# Patient Record
Sex: Female | Born: 1945 | Race: White | Hispanic: No | Marital: Married
Health system: Southern US, Community
[De-identification: ages and names within clinical notes are randomized; demographics above are authoritative.]

## PROBLEM LIST (undated history)

## (undated) DIAGNOSIS — E119 Type 2 diabetes mellitus without complications: Secondary | ICD-10-CM

## (undated) DIAGNOSIS — I10 Essential (primary) hypertension: Secondary | ICD-10-CM

## (undated) DIAGNOSIS — E785 Hyperlipidemia, unspecified: Secondary | ICD-10-CM

## (undated) DIAGNOSIS — M810 Age-related osteoporosis without current pathological fracture: Secondary | ICD-10-CM

## (undated) HISTORY — PX: SPINE SURGERY: SHX786

## (undated) HISTORY — DX: Hyperlipidemia, unspecified: E78.5

## (undated) HISTORY — DX: Type 2 diabetes mellitus without complications: E11.9

## (undated) HISTORY — DX: Age-related osteoporosis without current pathological fracture: M81.0

## (undated) HISTORY — DX: Essential (primary) hypertension: I10

## (undated) HISTORY — PX: TUBAL LIGATION: SHX77

---

## 1989-03-09 HISTORY — PX: LUMBAR DISC SURGERY: SHX700

## 1998-12-23 ENCOUNTER — Other Ambulatory Visit: Admission: RE | Admit: 1998-12-23 | Discharge: 1998-12-23 | Payer: Self-pay | Admitting: Gynecology

## 1999-12-31 ENCOUNTER — Other Ambulatory Visit: Admission: RE | Admit: 1999-12-31 | Discharge: 1999-12-31 | Payer: Self-pay | Admitting: Gynecology

## 2006-06-14 ENCOUNTER — Ambulatory Visit: Payer: Self-pay | Admitting: Gastroenterology

## 2006-06-28 ENCOUNTER — Ambulatory Visit: Payer: Self-pay | Admitting: Gastroenterology

## 2008-08-09 ENCOUNTER — Ambulatory Visit (HOSPITAL_COMMUNITY): Admission: RE | Admit: 2008-08-09 | Discharge: 2008-08-09 | Payer: Self-pay | Admitting: Family Medicine

## 2011-04-12 ENCOUNTER — Ambulatory Visit (INDEPENDENT_AMBULATORY_CARE_PROVIDER_SITE_OTHER): Payer: 59 | Admitting: Family Medicine

## 2011-04-12 DIAGNOSIS — R21 Rash and other nonspecific skin eruption: Secondary | ICD-10-CM

## 2011-04-12 DIAGNOSIS — E785 Hyperlipidemia, unspecified: Secondary | ICD-10-CM

## 2011-04-12 DIAGNOSIS — I10 Essential (primary) hypertension: Secondary | ICD-10-CM

## 2011-04-12 NOTE — Progress Notes (Signed)
Verbal consent obtained from the patient.  Local anesthesia with 2.5 cc 1% Lidocaine with epi.  Sterile prep and drape.  Elliptical excision of lesion en toto.  Packaged for pathology.  Would closed with #5 5-0 prolene HM and #1 5-0 prolene SI sutures.  Cleansed and dressed.

## 2011-04-12 NOTE — Patient Instructions (Signed)
WOUND CARE Please return in 5 days to have your stitches/staples removed or sooner if you have concerns.  Keep area clean and dry for 24 hours. Do not remove bandage, if applied.  After 24 hours, remove bandage and wash wound gently with mild soap and warm water. Reapply a new bandage after cleaning wound, if directed.  Continue daily cleansing with soap and water until stitches/staples are removed.  Do not apply any ointments or creams to the wound while stitches/staples are in place, as this may cause delayed healing.  Notify the office if you experience any of the following signs of infection: Swelling, redness, pus drainage, streaking, fever >101.0 F  Notify the office if you experience excessive bleeding that does not stop after 15-20 minutes of constant, firm pressure.  

## 2011-04-12 NOTE — Progress Notes (Signed)
66 year old woman who comes in complaining of a enlarging lesion just below the left year at the temporomandibular joint area.  The skin lesion has been there for more than 6 months recently started enlarging and become irritated.  Objective: A verrucous pigmented lesion approximately 1/2 cm in diameter is noted below the left ear. It is nontender but mildly inflamed at the margins.

## 2011-04-18 ENCOUNTER — Ambulatory Visit (INDEPENDENT_AMBULATORY_CARE_PROVIDER_SITE_OTHER): Payer: 59 | Admitting: Family Medicine

## 2011-04-18 VITALS — BP 131/74 | HR 64 | Temp 97.8°F | Resp 16 | Ht 67.0 in | Wt 185.2 lb

## 2011-04-18 DIAGNOSIS — Z4802 Encounter for removal of sutures: Secondary | ICD-10-CM

## 2011-04-18 NOTE — Progress Notes (Signed)
  Subjective:    Patient ID: Kristen Pratt, female    DOB: 1946-03-07, 66 y.o.   MRN: [id number]  HPI  Presents for SR. Lesion healed with redness, pain or drainage. Path report not yet back  Review of Systems     Objective:   Physical Exam  Skin: Laceration: suture line clean and intact, left jaw line.             Assessment & Plan:  Excision of verrucus lesion  A/P SR Follow up on pathology Anticipatory guidance

## 2011-05-11 ENCOUNTER — Other Ambulatory Visit: Payer: Self-pay | Admitting: Family Medicine

## 2011-09-26 ENCOUNTER — Other Ambulatory Visit: Payer: Self-pay | Admitting: Physician Assistant

## 2011-12-30 ENCOUNTER — Other Ambulatory Visit: Payer: Self-pay | Admitting: Radiology

## 2011-12-30 NOTE — Telephone Encounter (Signed)
Please advise on renewal of Triamcinolone cream. Pended Rx, have gotten 2 faxed from [facility] they can not send electronic b/c was written by Dr Hal Hope.

## 2011-12-31 ENCOUNTER — Other Ambulatory Visit: Payer: Self-pay | Admitting: *Deleted

## 2012-02-19 ENCOUNTER — Encounter: Payer: 59 | Admitting: Family Medicine

## 2012-02-20 ENCOUNTER — Ambulatory Visit: Payer: 59

## 2012-02-20 ENCOUNTER — Ambulatory Visit (INDEPENDENT_AMBULATORY_CARE_PROVIDER_SITE_OTHER): Payer: 59 | Admitting: Internal Medicine

## 2012-02-20 VITALS — BP 140/84 | HR 108 | Temp 98.6°F | Resp 18 | Ht 66.5 in | Wt 178.0 lb

## 2012-02-20 DIAGNOSIS — I10 Essential (primary) hypertension: Secondary | ICD-10-CM

## 2012-02-20 DIAGNOSIS — E785 Hyperlipidemia, unspecified: Secondary | ICD-10-CM

## 2012-02-20 DIAGNOSIS — R05 Cough: Secondary | ICD-10-CM

## 2012-02-20 DIAGNOSIS — L301 Dyshidrosis [pompholyx]: Secondary | ICD-10-CM

## 2012-02-20 MED ORDER — TRIAMCINOLONE ACETONIDE 0.1 % EX CREA: TOPICAL_CREAM | Freq: Two times a day (BID) | CUTANEOUS | Status: DC | PRN

## 2012-02-20 MED ORDER — DOXYCYCLINE HYCLATE 100 MG PO CAPS: 100.0000 mg | ORAL_CAPSULE | Freq: Two times a day (BID) | ORAL | Status: DC

## 2012-02-20 MED ORDER — BENZONATATE 100 MG PO CAPS: 100.0000 mg | ORAL_CAPSULE | Freq: Three times a day (TID) | ORAL | Status: DC | PRN

## 2012-02-20 MED ORDER — LOSARTAN POTASSIUM 50 MG PO TABS: 50.0000 mg | ORAL_TABLET | Freq: Every day | ORAL | Status: DC

## 2012-02-20 MED ORDER — PRAVASTATIN SODIUM 40 MG PO TABS: 40.0000 mg | ORAL_TABLET | Freq: Every day | ORAL | Status: DC

## 2012-02-20 NOTE — Progress Notes (Signed)
Subjective:    Patient ID: Kristen Pratt, female    DOB: 11-26-1945, 66 y.o.   MRN: [id number]  HPI This 66 y.o. female presents for evaluation of cough.  Dry and "hacky."  Coughing causes SOB and back pain.  Initially had laryngitis, but no sore throat or congestion.  No fever, chills, GI/GU symptoms.  She was seen 5 days ago at a [facility], where she was advised to use OTC chloricidin, but it hasn't helped.  Past Medical History  Diagnosis Date  . Hyperlipidemia   . Hypertension     Past Surgical History  Procedure Date  . Tubal ligation   . Lumbar disc surgery 1991    no complications  . Spine surgery     lumbar    Prior to Admission medications   Medication Sig Start Date End Date Taking? Authorizing Provider  aspirin 81 MG tablet Take 160 mg by mouth daily.   Yes Historical Provider, MD  losartan (COZAAR) 50 MG tablet Take 1 tablet (50 mg total) by mouth daily. NEEDS OFFICE VISIT/LABS FOR MORE 09/26/11  Yes Pattricia Boss, PA-C  pravastatin (PRAVACHOL) 40 MG tablet Take 1 tablet (40 mg total) by mouth daily. NEEDS OFFICE VISIT/LABS FOR MORE 09/26/11  Yes Pattricia Boss, PA-C  triamcinolone cream (KENALOG) 0.1 % Apply topically 2 (two) times daily as needed.   Yes Historical Provider, MD    No Known Allergies  History   Social History  . Marital Status: Married    Spouse Name: N/A    Number of Children: 1  . Years of Education: N/A   Occupational History  . Mail Handler    Social History Main Topics  . Smoking status: Passive Smoke Exposure - Never Smoker  . Smokeless tobacco: Never Used  . Alcohol Use: 0.0 - 0.6 oz/week    0-1 Glasses of wine per week     Comment: "every once in a while"  . Drug Use: No  . Sexually Active: Not on file   Other Topics Concern  . Not on file   Social History Narrative   Lives with husband.    Family History  Problem Relation Age of Onset  . Stroke Mother     Review of Systems As above.  Otherwise negative.   Objective:   Physical Exam Blood pressure 140/84, pulse 108, temperature 98.6 F (37 C), temperature source Oral, resp. rate 18, height 5' 6.5" (1.689 m), weight 178 lb (80.74 kg), SpO2 97.00%. Body mass index is 28.30 kg/(m^2). Well-developed, well nourished WF who is awake, alert and oriented, in NAD. HEENT: [place]/AT, sclera and conjunctiva are clear.  EAC are patent, TMs are normal in appearance. Nasal mucosa is pink and moist. OP is clear. Neck: supple, non-tender, no lymphadenopathy, thyromegaly. Heart: RRR, no murmur Lungs: normal effort, CTA, though course. Extremities: no cyanosis, clubbing or edema. Skin: warm and dry.  Thickened, cracking skin on the palms of both hands c/w dishidrotic eczema. Psychologic: good mood and appropriate affect, normal speech and behavior.  CXR: [facility] reading (PRIMARY) by  Dr. Merla Riches.  Increased markings, no acute process.     Assessment & Plan:   1. Cough  DG Chest 2 View, benzonatate (TESSALON) 100 MG capsule, doxycycline (VIBRAMYCIN) 100 MG capsule  2. Eczema, dyshidrotic  triamcinolone cream (KENALOG) 0.1 %  3. Hyperlipidemia  pravastatin (PRAVACHOL) 40 MG tablet  4. Hypertension  losartan (COZAAR) 50 MG tablet   Reschedule CPE.  Supportive care. I have reviewed and  agree with documentation. Robert P. Merla Riches, M.D.

## 2012-02-20 NOTE — Patient Instructions (Signed)
Get plenty of rest and drink at least 64 ounces of water daily. 

## 2012-02-22 NOTE — Progress Notes (Signed)
Left msg for pt to schedule CPE. Kristen Pratt

## 2012-02-24 NOTE — Progress Notes (Signed)
Sent reminder letter to pt to schedule CPE with Dr. Clelia Croft (pt no showed for previous appt). Kristen Pratt

## 2012-06-05 ENCOUNTER — Ambulatory Visit (INDEPENDENT_AMBULATORY_CARE_PROVIDER_SITE_OTHER): Payer: 59 | Admitting: Emergency Medicine

## 2012-06-05 ENCOUNTER — Ambulatory Visit: Payer: 59

## 2012-06-05 VITALS — BP 153/77 | HR 73 | Temp 97.5°F | Resp 18 | Ht 66.5 in | Wt 182.0 lb

## 2012-06-05 DIAGNOSIS — R300 Dysuria: Secondary | ICD-10-CM

## 2012-06-05 DIAGNOSIS — R319 Hematuria, unspecified: Secondary | ICD-10-CM

## 2012-06-05 DIAGNOSIS — R351 Nocturia: Secondary | ICD-10-CM

## 2012-06-05 LAB — POCT URINALYSIS DIPSTICK
Bilirubin, UA: NEGATIVE
Glucose, UA: NEGATIVE
Ketones, UA: NEGATIVE
Leukocytes, UA: NEGATIVE
Nitrite, UA: NEGATIVE
Protein, UA: NEGATIVE
Spec Grav, UA: 1.01
Urobilinogen, UA: 0.2
pH, UA: 6

## 2012-06-05 LAB — POCT CBC
Granulocyte percent: 71.2 %G (ref 37–80)
HCT, POC: 48.9 % — AB (ref 37.7–47.9)
Hemoglobin: 15.8 g/dL (ref 12.2–16.2)
MCH, POC: 30.3 pg (ref 27–31.2)
MCV: 93.7 fL (ref 80–97)
MID (cbc): 0.6 (ref 0–0.9)
RBC: 5.22 M/uL (ref 4.04–5.48)
WBC: 7.6 10*3/uL (ref 4.6–10.2)

## 2012-06-05 LAB — POCT UA - MICROSCOPIC ONLY
Bacteria, U Microscopic: NEGATIVE
Casts, Ur, LPF, POC: NEGATIVE
Mucus, UA: NEGATIVE
Yeast, UA: NEGATIVE

## 2012-06-05 MED ORDER — CIPROFLOXACIN HCL 250 MG PO TABS: 250.0000 mg | ORAL_TABLET | Freq: Two times a day (BID) | ORAL | Status: DC

## 2012-06-05 NOTE — Progress Notes (Signed)
Subjective:    Patient ID: Kristen Pratt, female    DOB: [birth year], 67 y.o.   MRN: [id number]  HPI  Pt presents with frequency, dysuria-woke about 5-6 times last night to urinate No fever/chills, back pain, nausea or vomiting Has never had a UTI but has had kidney stones. Generally healthy-just HTN and hyperlipidemia  Review of Systems     Objective:   Physical Exam    Results for orders placed in visit on 06/05/12  POCT URINALYSIS DIPSTICK      Result Value Range   Color, UA yellow     Clarity, UA clear     Glucose, UA neg     Bilirubin, UA neg     Ketones, UA neg     Spec Grav, UA 1.010     Blood, UA small     pH, UA 6.0     Protein, UA neg     Urobilinogen, UA 0.2     Nitrite, UA neg     Leukocytes, UA Negative    POCT UA - MICROSCOPIC ONLY      Result Value Range   WBC, Ur, HPF, POC 0-1     RBC, urine, microscopic 1-2     Bacteria, U Microscopic neg     Mucus, UA neg     Epithelial cells, urine per micros 0-1     Crystals, Ur, HPF, POC triple phos     Casts, Ur, LPF, POC neg     Yeast, UA neg     [facility] reading (PRIMARY) by  Dr. Cleta Alberts there are 3 areas of calcification in the area of the lower left ureter are suspicious for stones . Results for orders placed in visit on 06/05/12  POCT URINALYSIS DIPSTICK      Result Value Range   Color, UA yellow     Clarity, UA clear     Glucose, UA neg     Bilirubin, UA neg     Ketones, UA neg     Spec Grav, UA 1.010     Blood, UA small     pH, UA 6.0     Protein, UA neg     Urobilinogen, UA 0.2     Nitrite, UA neg     Leukocytes, UA Negative    POCT UA - MICROSCOPIC ONLY      Result Value Range   WBC, Ur, HPF, POC 0-1     RBC, urine, microscopic 1-2     Bacteria, U Microscopic neg     Mucus, UA neg     Epithelial cells, urine per micros 0-1     Crystals, Ur, HPF, POC triple phos     Casts, Ur, LPF, POC neg     Yeast, UA neg    POCT CBC      Result Value Range   WBC 7.6  4.6 - 10.2 K/uL   Lymph, poc 1.6   0.6 - 3.4   POC LYMPH PERCENT 20.9  10 - 50 %L   MID (cbc) 0.6  0 - 0.9   POC MID % 7.9  0 - 12 %M   POC Granulocyte 5.4  2 - 6.9   Granulocyte percent 71.2  37 - 80 %G   RBC 5.22  4.04 - 5.48 M/uL   Hemoglobin 15.8  12.2 - 16.2 g/dL   HCT, POC 40.9 (*) 81.1 - 47.9 %   MCV 93.7  80 - 97 fL   MCH, POC 30.3  27 -  31.2 pg   MCHC 32.3  31.8 - 35.4 g/dL   RDW, POC 16.1     Platelet Count, POC 254  142 - 424 K/uL   MPV 10.0  0 - 99.8 fL   [facility] reading (PRIMARY) by  Dr Cleta Alberts there are 3 tiny areas of what appear to be calcific densities in the lower left ureter.       Assessment & Plan:  KUB is suspicious for stones in the left lower ureter was set up a CT abdomen and pelvis without contrast. Patient was given a strainer culture was done we'll treat with Cipro for 5 days

## 2012-06-06 LAB — URINE CULTURE: Colony Count: 40000

## 2012-06-10 ENCOUNTER — Ambulatory Visit
Admission: RE | Admit: 2012-06-10 | Discharge: 2012-06-10 | Disposition: A | Discharge status: Home / Self Care | Payer: 59 | Source: Ambulatory Visit | Attending: Emergency Medicine | Admitting: Emergency Medicine

## 2012-06-10 DIAGNOSIS — R319 Hematuria, unspecified: Secondary | ICD-10-CM

## 2012-06-10 DIAGNOSIS — R300 Dysuria: Secondary | ICD-10-CM

## 2013-05-15 ENCOUNTER — Encounter: Payer: Self-pay | Admitting: Family Medicine

## 2013-05-15 ENCOUNTER — Ambulatory Visit (INDEPENDENT_AMBULATORY_CARE_PROVIDER_SITE_OTHER): Payer: 59 | Admitting: Family Medicine

## 2013-05-15 VITALS — BP 144/70 | HR 70 | Temp 97.6°F | Resp 16 | Ht 67.0 in | Wt 185.0 lb

## 2013-05-15 DIAGNOSIS — R7301 Impaired fasting glucose: Secondary | ICD-10-CM

## 2013-05-15 DIAGNOSIS — D751 Secondary polycythemia: Secondary | ICD-10-CM

## 2013-05-15 DIAGNOSIS — M7500 Adhesive capsulitis of unspecified shoulder: Secondary | ICD-10-CM

## 2013-05-15 DIAGNOSIS — E785 Hyperlipidemia, unspecified: Secondary | ICD-10-CM

## 2013-05-15 DIAGNOSIS — Z0000 Encounter for general adult medical examination without abnormal findings: Secondary | ICD-10-CM

## 2013-05-15 DIAGNOSIS — I10 Essential (primary) hypertension: Secondary | ICD-10-CM

## 2013-05-15 DIAGNOSIS — L301 Dyshidrosis [pompholyx]: Secondary | ICD-10-CM

## 2013-05-15 DIAGNOSIS — Z23 Encounter for immunization: Secondary | ICD-10-CM

## 2013-05-15 LAB — COMPREHENSIVE METABOLIC PANEL
ALBUMIN: 4.6 g/dL (ref 3.5–5.2)
ALT: 14 U/L (ref 0–35)
AST: 19 U/L (ref 0–37)
Alkaline Phosphatase: 90 U/L (ref 39–117)
BUN: 20 mg/dL (ref 6–23)
CALCIUM: 9.8 mg/dL (ref 8.4–10.5)
CHLORIDE: 101 meq/L (ref 96–112)
CO2: 27 mEq/L (ref 19–32)
CREATININE: 0.73 mg/dL (ref 0.50–1.10)
Glucose, Bld: 108 mg/dL — ABNORMAL HIGH (ref 70–99)
POTASSIUM: 4 meq/L (ref 3.5–5.3)
SODIUM: 138 meq/L (ref 135–145)
Total Bilirubin: 0.6 mg/dL (ref 0.2–1.2)
Total Protein: 7.4 g/dL (ref 6.0–8.3)

## 2013-05-15 LAB — CBC
HCT: 43.9 % (ref 36.0–46.0)
Hemoglobin: 15.2 g/dL — ABNORMAL HIGH (ref 12.0–15.0)
MCH: 30.4 pg (ref 26.0–34.0)
MCHC: 34.6 g/dL (ref 30.0–36.0)
MCV: 87.8 fL (ref 78.0–100.0)
PLATELETS: 219 10*3/uL (ref 150–400)
RBC: 5 MIL/uL (ref 3.87–5.11)
RDW: 13.4 % (ref 11.5–15.5)
WBC: 7.2 10*3/uL (ref 4.0–10.5)

## 2013-05-15 LAB — LIPID PANEL
Cholesterol: 138 mg/dL (ref 0–200)
HDL: 33 mg/dL — AB (ref >39)
LDL CALC: 87 mg/dL (ref 0–99)
Total CHOL/HDL Ratio: 4.2 Ratio
Triglycerides: 91 mg/dL (ref <150)
VLDL: 18 mg/dL (ref 0–40)

## 2013-05-15 LAB — TSH: TSH: 2.011 u[IU]/mL (ref 0.350–4.500)

## 2013-05-15 MED ORDER — LOSARTAN POTASSIUM 50 MG PO TABS: 50.0000 mg | ORAL_TABLET | Freq: Every day | ORAL | Status: DC

## 2013-05-15 MED ORDER — TRIAMCINOLONE ACETONIDE 0.1 % EX CREA: TOPICAL_CREAM | Freq: Two times a day (BID) | CUTANEOUS | Status: DC | PRN

## 2013-05-15 MED ORDER — ATORVASTATIN CALCIUM 40 MG PO TABS: 40.0000 mg | ORAL_TABLET | Freq: Every day | ORAL | Status: DC

## 2013-05-15 NOTE — Progress Notes (Signed)
[facility] [address] [place] [address] [phone]  Date:  05/15/2013   Name:  Kristen Pratt   DOB:  1945-06-25   MRN:  [id number]  PCP:  No primary provider on file.    Chief Complaint: Annual Exam   History of Present Illness:  Kristen Pratt is a 68 y.o. very pleasant female patient who presents with the following:  Here today for an annual PE.  Last labs about a year ago. History of HTN and high cholesterol.   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  She does have dyshydrotic eczema on her hands. She also has noted some stiffness in her left shoulder for about 6 months- she has a hard time abducting as far as on the right.    Last Td given in 2006- she would like to have Tdap today  Patient Active Problem List   Diagnosis Date Noted  . Eczema, dyshidrotic 02/20/2012  . Hyperlipidemia 04/12/2011  . Hypertension 04/12/2011    Past Medical History  Diagnosis Date  . Hyperlipidemia   . Hypertension     Past Surgical History  Procedure Laterality Date  . Tubal ligation    . Lumbar disc surgery  9326    no complications  . Spine surgery      lumbar    History  Substance Use Topics  . Smoking status: Passive Smoke Exposure - Never Smoker  . Smokeless tobacco: Never Used  . Alcohol Use: 0 - .6 oz/week    0-1 Glasses of wine per week     Comment: "every once in a while"    Family History  Problem Relation Age of Onset  . Stroke Mother     No Known Allergies  Medication list has been reviewed and updated.  Current Outpatient Prescriptions on File Prior to Visit  Medication Sig Dispense Refill  . aspirin 81 MG tablet Take 160 mg by mouth daily.      Marland Kitchen losartan (COZAAR) 50 MG tablet Take 1 tablet (50 mg  total) by mouth daily.  90 tablet  1  . triamcinolone cream (KENALOG) 0.1 % Apply topically 2 (two) times daily as needed.  30 g  2   No current facility-administered medications on file prior to visit.    Review of Systems:  As per HPI- otherwise negative.   Physical Examination: Filed Vitals:   05/15/13 0813  BP: 152/71  Pulse: 70  Temp: 97.6 F (36.4 C)  Resp: 16   Filed Vitals:   05/15/13 0813  Height: 5\' 7"  (1.702 m)  Weight: 185 lb (83.915 kg)   Body mass index is 28.97 kg/(m^2). Ideal Body Weight: Weight in (lb) to have BMI = 25: 159.3  GEN: WDWN, NAD, Non-toxic, A & O x 3, overweight, looks well HEENT: Atraumatic, Normocephalic. Neck supple. No masses, No LAD.  Bilateral TM wnl, oropharynx normal.  PEERL,EOMI.  Ears and Nose: No external deformity. CV: RRR, No M/G/R. No JVD. No thrill. No extra heart sounds. PULM: CTA B, no wheezes, crackles, rhonchi. No retractions. No resp. distress. No accessory muscle use. ABD: S, NT, ND, +BS. No rebound. No HSM. EXTR: No c/c/e NEURO Normal gait.  PSYCH:  Normally interactive. Conversant. Not depressed or anxious appearing.  Calm demeanor.  Left shoulder: she has flexion to 85- 90 degrees, but abduction only to about 45 degrees. Internal and external rotation are also severely limited.  Normal hand strength and biceps/ triceps strength, normal deltoid strength.  Breast exam: normal bilaterally, no masses/ dimpling/discharge  Wt Readings from Last 3 Encounters:  05/15/13 185 lb (83.915 kg)  06/05/12 182 lb (82.555 kg)  02/20/12 178 lb (80.74 kg)     Assessment and Plan: Annual physical exam - Plan: CBC, TSH, Tdap vaccine greater than or equal to 7yo IM, CANCELED: POCT urinalysis dipstick  Other and unspecified hyperlipidemia - Plan: Lipid panel, atorvastatin (LIPITOR) 40 MG tablet  Essential hypertension, benign - Plan: Comprehensive metabolic panel  Hypertension - Plan: losartan (COZAAR) 50 MG tablet  Eczema,  dyshidrotic - Plan: triamcinolone cream (KENALOG) 0.1 %  Frozen shoulder - Plan: Ambulatory referral to Physical Therapy  Updated labs, did refills today, update immunizations  See patient instructions for more details.     Signed Lamar Blinks, MD

## 2013-05-15 NOTE — Addendum Note (Signed)
Addended by: Lamar Blinks C on: 05/15/2013 08:31 PM   Modules accepted: Orders

## 2013-05-15 NOTE — Patient Instructions (Signed)
Please keep an eye on your BP- we would like you to run less than 140/85.  If you find that you are running higher than this on average please give me a call or mychart message. Try to check your BP once a week or so for the next 6 weeks.   You have gained about 7 lbs since 2013.  Please keep an eye on your weight and physical activity.  Unfortunately keeping your weight steady can be challenging as we get older, so it is important to be vigilant.  Please work on losing about 5 lbs over the next year or so by making small changes  I will be in touch with your labs when they come in  You can get your shingles shot at your convenience- this can be done at many major drug stores and is a "one time" shot.    I will get your set up with physical therapy to work on your shoulder!

## 2014-05-09 DIAGNOSIS — Z1239 Encounter for other screening for malignant neoplasm of breast: Secondary | ICD-10-CM | POA: Diagnosis not present

## 2014-05-09 DIAGNOSIS — Z0000 Encounter for general adult medical examination without abnormal findings: Secondary | ICD-10-CM | POA: Diagnosis not present

## 2014-05-09 DIAGNOSIS — Z13820 Encounter for screening for osteoporosis: Secondary | ICD-10-CM | POA: Diagnosis not present

## 2014-05-09 DIAGNOSIS — E782 Mixed hyperlipidemia: Secondary | ICD-10-CM | POA: Diagnosis not present

## 2014-05-09 DIAGNOSIS — I10 Essential (primary) hypertension: Secondary | ICD-10-CM | POA: Diagnosis not present

## 2014-05-10 DIAGNOSIS — R7309 Other abnormal glucose: Secondary | ICD-10-CM | POA: Diagnosis not present

## 2014-05-14 DIAGNOSIS — Z1231 Encounter for screening mammogram for malignant neoplasm of breast: Secondary | ICD-10-CM | POA: Diagnosis not present

## 2014-05-14 DIAGNOSIS — M8580 Other specified disorders of bone density and structure, unspecified site: Secondary | ICD-10-CM | POA: Diagnosis not present

## 2014-06-04 DIAGNOSIS — Z1151 Encounter for screening for human papillomavirus (HPV): Secondary | ICD-10-CM | POA: Diagnosis not present

## 2014-06-04 DIAGNOSIS — N889 Noninflammatory disorder of cervix uteri, unspecified: Secondary | ICD-10-CM | POA: Diagnosis not present

## 2014-06-04 DIAGNOSIS — E119 Type 2 diabetes mellitus without complications: Secondary | ICD-10-CM | POA: Diagnosis not present

## 2014-06-04 DIAGNOSIS — Z124 Encounter for screening for malignant neoplasm of cervix: Secondary | ICD-10-CM | POA: Diagnosis not present

## 2016-04-20 ENCOUNTER — Encounter: Payer: Self-pay | Admitting: Gastroenterology

## 2016-06-20 ENCOUNTER — Ambulatory Visit (INDEPENDENT_AMBULATORY_CARE_PROVIDER_SITE_OTHER): Payer: Medicare HMO | Admitting: Family Medicine

## 2016-06-20 VITALS — BP 159/73 | HR 78 | Temp 97.9°F | Resp 17 | Ht 66.5 in | Wt 195.1 lb

## 2016-06-20 DIAGNOSIS — Z0000 Encounter for general adult medical examination without abnormal findings: Secondary | ICD-10-CM

## 2016-06-20 DIAGNOSIS — L301 Dyshidrosis [pompholyx]: Secondary | ICD-10-CM | POA: Diagnosis not present

## 2016-06-20 DIAGNOSIS — I10 Essential (primary) hypertension: Secondary | ICD-10-CM | POA: Diagnosis not present

## 2016-06-20 DIAGNOSIS — E119 Type 2 diabetes mellitus without complications: Secondary | ICD-10-CM

## 2016-06-20 DIAGNOSIS — L570 Actinic keratosis: Secondary | ICD-10-CM

## 2016-06-20 DIAGNOSIS — E785 Hyperlipidemia, unspecified: Secondary | ICD-10-CM | POA: Diagnosis not present

## 2016-06-20 LAB — POCT URINALYSIS DIP (MANUAL ENTRY)
BILIRUBIN UA: NEGATIVE
BILIRUBIN UA: NEGATIVE mg/dL
Blood, UA: NEGATIVE
Glucose, UA: NEGATIVE mg/dL
LEUKOCYTES UA: NEGATIVE
NITRITE UA: NEGATIVE
PH UA: 7 (ref 5.0–8.0)
Protein Ur, POC: NEGATIVE mg/dL
Spec Grav, UA: <=1.005 — AB (ref 1.010–1.025)
Urobilinogen, UA: 0.2 E.U./dL

## 2016-06-20 LAB — POCT GLYCOSYLATED HEMOGLOBIN (HGB A1C): HEMOGLOBIN A1C: 6.7

## 2016-06-20 NOTE — Patient Instructions (Addendum)
  You're being referred to a dermatologist. You should hear from our referrals department in the next week. If you do not please call back and ask what became of the referral.  We will contact you with results of your labs.  The hemoglobin A1c which was done here in the office was 6.7, which is indicating diabetes in fair control. The goal of the hemoglobin A1c in a diabetic is to keep it between 6 and 7.  With diabetes is important to try to get regular exercise, such as a daily walk. Also try to eat less to that you can lose a little weight.  On the way out make an appointment to follow-up regularly with one of the other providers here at this practice. They can follow up on the results of your bone density, mammogram, and lab work also. We would like to ask you to get the results of the bone density and mammogram sent here to this office.   IF you received an x-ray today, you will receive an invoice from [facility]. Please contact [facility] at [phone] with questions or concerns regarding your invoice.   IF you received labwork today, you will receive an invoice from [facility]. Please contact [facility] at [phone] with questions or concerns regarding your invoice.   Our billing staff will not be able to assist you with questions regarding bills from these companies.  You will be contacted with the lab results as soon as they are available. The fastest way to get your results is to activate your My Chart account. Instructions are located on the last page of this paperwork. If you have not heard from [place] regarding the results in 2 weeks, please contact this office.

## 2016-06-20 NOTE — Progress Notes (Signed)
Patient ID: Kristen Pratt, female    DOB: 12-16-1945  Age: 71 y.o. MRN: [id number]  Chief Complaint  Patient presents with  . Annual Exam    CPE    Subjective:   Annual physical examination:  Patient is here for her annual physical examination. She has no major problems with complaints. She just felt like he was time to get a physical. She is previously been going to physicians in the [facility] area and would like to be back up here in [place]. She has come here some years ago.  Past history: Gen. healthy. He had back surgery many years ago. She has a history of type 2 diabetes, hypertension, hyperlipidemia, history of porosis. No major changes in them. Does not need any medications. Her medication list in the chart as of today.    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 Does some walking. Does not smoke, drink, or use substances. Has been married to the same man for 25 years. No fears of STDs.  Review of systems Constitutional: Unremarkable HEENT: Unremarkable Cardiovascular: Unremarkable Respiratory: Unremarkable GI: Unremarkable GU: Unremarkable. She has been following up with a gynecologist for some vaginal symptoms which seem to have resolved now. She is scheduled for a mammogram sometime in the near future Musculoskeletal: Has osteoporosis and is on medication for that. She is scheduled for bone density in a few weeks. Dermatologic: Has skin lesions on her face. Does not see a dermatologist Neurologic: Unremarkable Psychiatric: Unremarkable       Current allergies, medications, problem list, past/family and social histories reviewed.  Objective:  BP (!) 159/73   Pulse 78   Temp 97.9 F (36.6 C) (Oral)   Resp 17   Ht 5' 6.5" (1.689 m)   Wt 195 lb 2 oz (88.5 kg)   SpO2 95%   BMI 31.02 kg/m   Healthy lady, a little overweight. TMs normal. Eyes PERRLA. Fundi  benign. Wears glasses. Has a regular doctor she sees annually. She has multiple [place] getting these on her face including a prominent seborrheic keratosis on the left temple area and some actinic keratoses and smaller seborrheic keratoses. Her throat is clear. Wears dentures upper and lower. Neck supple without nodes. No carotid bruits. Chest clear to auscultation. Heart regular without murmurs, gallops, or arrhythmias. Breasts are full without a mass. No axillary nodes. Abdomen soft without mass or tenderness. No inguinal nodes. Extremities without edema. Skin warm and dry.  Assessment & Plan:   Assessment: 1. Annual physical exam   2. Essential hypertension   3. Eczema, dyshidrotic   4. Hyperlipidemia, unspecified hyperlipidemia type   5. Type 2 diabetes mellitus without complication, without long-term current use of insulin ([place])   6. Actinic keratoses       Plan: Labs are ordered. Will refer to a dermatologist. She needs to be plugged in for regular doctor here.  Orders Placed This Encounter  Procedures  . Comprehensive metabolic panel  . Lipid panel  . CBC  . Ambulatory referral to Dermatology    Referral Priority:   Routine    Referral Type:   Consultation    Referral Reason:   Specialty Services Required    Requested Specialty:   Dermatology    Number of Visits Requested:   1  . POCT glycosylated hemoglobin (Hb A1C)  . POCT urinalysis dipstick   Results for orders placed or performed  in visit on 06/20/16  POCT glycosylated hemoglobin (Hb A1C)  Result Value Ref Range   Hemoglobin A1C 6.7   POCT urinalysis dipstick  Result Value Ref Range   Color, UA yellow yellow   Clarity, UA clear clear   Glucose, UA negative negative mg/dL   Bilirubin, UA negative negative   Ketones, POC UA negative negative mg/dL   Spec Grav, UA <=1.005 (A) 1.010 - 1.025   Blood, UA negative negative   pH, UA 7.0 5.0 - 8.0   Protein Ur, POC negative negative mg/dL   Urobilinogen, UA 0.2 0.2 or  1.0 E.U./dL   Nitrite, UA Negative Negative   Leukocytes, UA Negative Negative     Meds ordered this encounter  Medications  . alendronate (FOSAMAX) 70 MG tablet    Sig: Take 70 mg by mouth once a week. Take on the same day each week  . metFORMIN (GLUCOPHAGE-XR) 500 MG 24 hr tablet    Sig: Take 500 mg by mouth daily.         Patient Instructions    You're being referred to a dermatologist. You should hear from our referrals department in the next week. If you do not please call back and ask what became of the referral.  We will contact you with results of your labs.  The hemoglobin A1c which was done here in the office was 6.7, which is indicating diabetes in fair control. The goal of the hemoglobin A1c in a diabetic is to keep it between 6 and 7.  With diabetes is important to try to get regular exercise, such as a daily walk. Also try to eat less to that you can lose a little weight.  On the way out make an appointment to follow-up regularly with one of the other providers here at this practice. They can follow up on the results of your bone density, mammogram, and lab work also. We would like to ask you to get the results of the bone density and mammogram sent here to this office.   IF you received an x-ray today, you will receive an invoice from [facility]. Please contact [facility] at [phone] with questions or concerns regarding your invoice.   IF you received labwork today, you will receive an invoice from [facility]. Please contact [facility] at [phone] with questions or concerns regarding your invoice.   Our billing staff will not be able to assist you with questions regarding bills from these companies.  You will be contacted with the lab results as soon as they are available. The fastest way to get your results is to activate your My Chart account. Instructions are located on the last page of this paperwork. If you have not heard from [place]  regarding the results in 2 weeks, please contact this office.         Return in about 3 months (around 09/19/2016) for schedule with a regular provider.   HOPPER,DAVID, MD 06/20/2016

## 2016-06-21 LAB — COMPREHENSIVE METABOLIC PANEL
ALK PHOS: 99 IU/L (ref 39–117)
ALT: 20 IU/L (ref 0–32)
AST: 26 IU/L (ref 0–40)
Albumin/Globulin Ratio: 1.6 (ref 1.2–2.2)
Albumin: 4.6 g/dL (ref 3.6–4.8)
BUN/Creatinine Ratio: 19 (ref 12–28)
BUN: 14 mg/dL (ref 8–27)
Bilirubin Total: 0.5 mg/dL (ref 0.0–1.2)
CO2: 23 mmol/L (ref 18–29)
CREATININE: 0.73 mg/dL (ref 0.57–1.00)
Calcium: 9.6 mg/dL (ref 8.7–10.3)
Chloride: 98 mmol/L (ref 96–106)
GFR calc Af Amer: 98 mL/min/{1.73_m2} (ref >59)
GFR calc non Af Amer: 85 mL/min/{1.73_m2} (ref >59)
Globulin, Total: 2.9 g/dL (ref 1.5–4.5)
Glucose: 99 mg/dL (ref 65–99)
Potassium: 4.2 mmol/L (ref 3.5–5.2)
Sodium: 140 mmol/L (ref 134–144)
Total Protein: 7.5 g/dL (ref 6.0–8.5)

## 2016-06-21 LAB — CBC
Hematocrit: 44.8 % (ref 34.0–46.6)
Hemoglobin: 15 g/dL (ref 11.1–15.9)
MCH: 29.9 pg (ref 26.6–33.0)
MCHC: 33.5 g/dL (ref 31.5–35.7)
MCV: 89 fL (ref 79–97)
PLATELETS: 245 10*3/uL (ref 150–379)
RBC: 5.02 x10E6/uL (ref 3.77–5.28)
RDW: 13.7 % (ref 12.3–15.4)
WBC: 6.9 10*3/uL (ref 3.4–10.8)

## 2016-06-21 LAB — LIPID PANEL
CHOLESTEROL TOTAL: 130 mg/dL (ref 100–199)
Chol/HDL Ratio: 3.5 ratio (ref 0.0–4.4)
HDL: 37 mg/dL — AB (ref >39)
LDL CALC: 78 mg/dL (ref 0–99)
TRIGLYCERIDES: 73 mg/dL (ref 0–149)
VLDL CHOLESTEROL CAL: 15 mg/dL (ref 5–40)

## 2016-06-25 ENCOUNTER — Encounter: Payer: Self-pay | Admitting: Radiology

## 2016-07-03 ENCOUNTER — Encounter: Payer: Self-pay | Admitting: Family Medicine

## 2016-07-14 ENCOUNTER — Encounter: Payer: Self-pay | Admitting: Family Medicine

## 2016-07-14 DIAGNOSIS — M810 Age-related osteoporosis without current pathological fracture: Secondary | ICD-10-CM | POA: Insufficient documentation

## 2016-08-10 DIAGNOSIS — L940 Localized scleroderma [morphea]: Secondary | ICD-10-CM | POA: Diagnosis not present

## 2016-08-28 DIAGNOSIS — L820 Inflamed seborrheic keratosis: Secondary | ICD-10-CM | POA: Diagnosis not present

## 2016-08-28 DIAGNOSIS — L570 Actinic keratosis: Secondary | ICD-10-CM | POA: Diagnosis not present

## 2016-09-08 ENCOUNTER — Telehealth: Payer: Self-pay

## 2016-09-08 NOTE — Telephone Encounter (Signed)
Left message on patient's cell voicemail that she may pick up her records from the office ([address])

## 2016-09-08 NOTE — Telephone Encounter (Signed)
Records placed in envelope in RX pick up bin at [address] office.

## 2016-09-16 ENCOUNTER — Ambulatory Visit (INDEPENDENT_AMBULATORY_CARE_PROVIDER_SITE_OTHER): Payer: Medicare HMO | Admitting: Family Medicine

## 2016-09-16 ENCOUNTER — Encounter: Payer: Self-pay | Admitting: Family Medicine

## 2016-09-16 VITALS — BP 130/70 | HR 69 | Temp 97.9°F | Resp 17 | Ht 66.5 in | Wt 187.0 lb

## 2016-09-16 DIAGNOSIS — I10 Essential (primary) hypertension: Secondary | ICD-10-CM | POA: Diagnosis not present

## 2016-09-16 DIAGNOSIS — M810 Age-related osteoporosis without current pathological fracture: Secondary | ICD-10-CM | POA: Diagnosis not present

## 2016-09-16 DIAGNOSIS — E119 Type 2 diabetes mellitus without complications: Secondary | ICD-10-CM | POA: Diagnosis not present

## 2016-09-16 NOTE — Assessment & Plan Note (Addendum)
Continue Fosamax on year 3 of 5, weight bearing and continue multivitamin

## 2016-09-16 NOTE — Progress Notes (Signed)
Chief Complaint  Patient presents with  . Establish Care    HPI This is a patient of the practice that would like to establish care with a new provider in the office   She is here to discuss her osteoporosis She is taking Fosamax and a calcium with vitamin D supplement She is walking for exercise She also works twice a week  Diabetes mellitus She takes metformin  No side effects She is sticking to a diabetic diet Lab Results  Component Value Date   HGBA1C 6.7 06/20/2016   Lab Results  Component Value Date   CREATININE 0.73 06/20/2016     Past Medical History:  Diagnosis Date  . Diabetes mellitus without complication ([place])   . Hyperlipidemia   . Hypertension   . Osteoporosis     Current Outpatient Prescriptions  Medication Sig Dispense Refill  . alendronate (FOSAMAX) 70 MG tablet Take 70 mg by mouth once a week. Take on the same day each week    . aspirin 81 MG tablet Take 160 mg by mouth daily.    Marland Kitchen atorvastatin (LIPITOR) 40 MG tablet Take 1 tablet (40 mg total) by mouth daily. 90 tablet 3  . losartan (COZAAR) 50 MG tablet Take 1 tablet (50 mg total) by mouth daily. 90 tablet 3  . metFORMIN (GLUCOPHAGE-XR) 500 MG 24 hr tablet Take 500 mg by mouth daily.    Marland Kitchen triamcinolone cream (KENALOG) 0.1 % Apply topically 2 (two) times daily as needed. 454 g 3   No current facility-administered medications for this visit.     Allergies: No Known Allergies  Past Surgical History:  Procedure Laterality Date  . LUMBAR DISC SURGERY  4765   no complications  . SPINE SURGERY     lumbar  . TUBAL LIGATION      Social History   Social History  . Marital status: Married    Spouse name: Richard  . Number of children: 1  . Years of education: 15   Occupational History  . Mail Handler-Supervisor    Social History Main Topics  . Smoking status: Passive Smoke Exposure - Never Smoker  . Smokeless tobacco: Never Used  . Alcohol use 0.0 - 0.6 oz/week     Comment: "every  once in a while"  . Drug use: No  . Sexual activity: Yes    Birth control/ protection: None   Other Topics Concern  . None   Social History Narrative   Lives with husband.    ROS  Objective: Vitals:   09/16/16 0916 09/16/16 0920  BP: (!) 173/68 130/70  Pulse: 78 69  Resp: 17   Temp: 97.9 F (36.6 C)   TempSrc: Oral   SpO2: 97%   Weight: 187 lb (84.8 kg)   Height: 5' 6.5" (1.689 m)    Diabetic Foot Exam - Simple   Simple Foot Form Visual Inspection No deformities, no ulcerations, no other skin breakdown bilaterally:  Yes Sensation Testing Intact to touch and monofilament testing bilaterally:  Yes Pulse Check Posterior Tibialis and Dorsalis pulse intact bilaterally:  Yes Comments      Physical Exam  Constitutional: She is oriented to person, place, and time. She appears well-developed and well-nourished.  HENT:  Head: Normocephalic and atraumatic.  Eyes: Conjunctivae are normal.  Cardiovascular: Normal rate, regular rhythm and normal heart sounds.   No murmur heard. Pulmonary/Chest: Effort normal and breath sounds normal. No respiratory distress. She has no wheezes.  Neurological: She is alert and oriented to  person, place, and time.  Psychiatric: She has a normal mood and affect. Her behavior is normal. Judgment and thought content normal.    Assessment and Plan Brunella was seen today for establish care.  Diagnoses and all orders for this visit:  Essential hypertension  Type 2 diabetes mellitus without complication, without long-term current use of insulin (HCC)  Age-related osteoporosis without current pathological fracture   Problem List Items Addressed This Visit      Cardiovascular and Mediastinum   Hypertension - Primary    BP at goal for diabetes Continue DASH diet        Endocrine   Type 2 diabetes mellitus without complication, without long-term current use of insulin ([place])    Continue current meds Will check a1c in October 2018          Musculoskeletal and Integument   Osteoporosis    Continue Fosamax, weight bearing and continue multivitamin           Kristen Pratt A Nolon Rod

## 2016-09-16 NOTE — Assessment & Plan Note (Signed)
Continue current meds Will check a1c in October 2018

## 2016-09-16 NOTE — Patient Instructions (Addendum)
   IF you received an x-ray today, you will receive an invoice from [facility]. Please contact [facility] at [phone] with questions or concerns regarding your invoice.   IF you received labwork today, you will receive an invoice from [facility]. Please contact [facility] at [phone] with questions or concerns regarding your invoice.   Our billing staff will not be able to assist you with questions regarding bills from these companies.  You will be contacted with the lab results as soon as they are available. The fastest way to get your results is to activate your My Chart account. Instructions are located on the last page of this paperwork. If you have not heard from us regarding the results in 2 weeks, please contact this office.     Bone Health Bones protect organs, store calcium, and anchor muscles. Good health habits, such as eating nutritious foods and exercising regularly, are important for maintaining healthy bones. They can also help to prevent a condition that causes bones to lose density and become weak and brittle (osteoporosis). Why is bone mass important? Bone mass refers to the amount of bone tissue that you have. The higher your bone mass, the stronger your bones. An important step toward having healthy bones throughout life is to have strong and dense bones during childhood. A young adult who has a high bone mass is more likely to have a high bone mass later in life. Bone mass at its greatest it is called peak bone mass. A large decline in bone mass occurs in older adults. In women, it occurs about the time of menopause. During this time, it is important to practice good health habits, because if more bone is lost than what is replaced, the bones will become less healthy and more likely to break (fracture). If you find that you have a low bone mass, you may be able to prevent osteoporosis or further bone loss by changing your diet and lifestyle. How can I  find out if my bone mass is low? Bone mass can be measured with an X-ray test that is called a bone mineral density (BMD) test. This test is recommended for all women who are age 65 or older. It may also be recommended for men who are age 70 or older, or for people who are more likely to develop osteoporosis due to:  Having bones that break easily.  Having a long-term disease that weakens bones, such as kidney disease or rheumatoid arthritis.  Having menopause earlier than normal.  Taking medicine that weakens bones, such as steroids, thyroid hormones, or hormone treatment for breast cancer or prostate cancer.  Smoking.  Drinking three or more alcoholic drinks each day.  What are the nutritional recommendations for healthy bones? To have healthy bones, you need to get enough of the right minerals and vitamins. Most nutrition experts recommend getting these nutrients from the foods that you eat. Nutritional recommendations vary from person to person. Ask your health care provider what is healthy for you. Here are some general guidelines. Calcium Recommendations Calcium is the most important (essential) mineral for bone health. Most people can get enough calcium from their diet, but supplements may be recommended for people who are at risk for osteoporosis. Good sources of calcium include:  Dairy products, such as low-fat or nonfat milk, cheese, and yogurt.  Dark green leafy vegetables, such as bok choy and broccoli.  Calcium-fortified foods, such as orange juice, cereal, bread, soy beverages, and tofu products.  Nuts,   such as almonds.  Follow these recommended amounts for daily calcium intake:  Children, age 1?3: 700 mg.  Children, age 4?8: 1,000 mg.  Children, age 9?13: 1,300 mg.  Teens, age 14?18: 1,300 mg.  Adults, age 19?50: 1,000 mg.  Adults, age 51?70: ? Men: 1,000 mg. ? Women: 1,200 mg.  Adults, age 71 or older: 1,200 mg.  Pregnant and breastfeeding  females: ? Teens: 1,300 mg. ? Adults: 1,000 mg.  Vitamin D Recommendations Vitamin D is the most essential vitamin for bone health. It helps the body to absorb calcium. Sunlight stimulates the skin to make vitamin D, so be sure to get enough sunlight. If you live in a cold climate or you do not get outside often, your health care provider may recommend that you take vitamin D supplements. Good sources of vitamin D in your diet include:  Egg yolks.  Saltwater fish.  Milk and cereal fortified with vitamin D.  Follow these recommended amounts for daily vitamin D intake:  Children and teens, age 1?18: 600 international units.  Adults, age 50 or younger: 400-800 international units.  Adults, age 51 or older: 800-1,000 international units.  Other Nutrients Other nutrients for bone health include:  Phosphorus. This mineral is found in meat, poultry, dairy foods, nuts, and legumes. The recommended daily intake for adult men and adult women is 700 mg.  Magnesium. This mineral is found in seeds, nuts, dark green vegetables, and legumes. The recommended daily intake for adult men is 400?420 mg. For adult women, it is 310?320 mg.  Vitamin K. This vitamin is found in green leafy vegetables. The recommended daily intake is 120 mg for adult men and 90 mg for adult women.  What type of physical activity is best for building and maintaining healthy bones? Weight-bearing and strength-building activities are important for building and maintaining peak bone mass. Weight-bearing activities cause muscles and bones to work against gravity. Strength-building activities increases muscle strength that supports bones. Weight-bearing and muscle-building activities include:  Walking and hiking.  Jogging and running.  Dancing.  Gym exercises.  Lifting weights.  Tennis and racquetball.  Climbing stairs.  Aerobics.  Adults should get at least 30 minutes of moderate physical activity on most days.  Children should get at least 60 minutes of moderate physical activity on most days. Ask your health care provide what type of exercise is best for you. Where can I find more information? For more information, check out the following websites:  National Osteoporosis Foundation: http://nof.org/learn/basics  National Institutes of Health: http://www.niams.nih.gov/Health_Info/Bone/Bone_Health/bone_health_for_life.asp  This information is not intended to replace advice given to you by your health care provider. Make sure you discuss any questions you have with your health care provider. Document Released: 05/16/2003 Document Revised: 09/13/2015 Document Reviewed: 02/28/2014 Elsevier Interactive Patient Education  2018 Elsevier Inc.  

## 2016-09-16 NOTE — Assessment & Plan Note (Signed)
BP at goal for diabetes Continue DASH diet

## 2016-10-19 ENCOUNTER — Telehealth: Payer: Self-pay | Admitting: Family Medicine

## 2016-10-19 NOTE — Telephone Encounter (Signed)
Pt came in office and stated that she is a new pt of [facility] and she needs refills on her meds metFORMIN (GLUCOPHAGE-XR) 500 MG 24 hr tablet [[id number], atorvastatin (LIPITOR) 40 MG tablet [[id number]], losartan (COZAAR) 50 MG tablet [[id number].Marland Kitchen Pt uses [facility] [phone].Marland Kitchen  Please advise pt: [phone]

## 2016-10-22 ENCOUNTER — Other Ambulatory Visit: Payer: Self-pay

## 2016-10-22 DIAGNOSIS — I10 Essential (primary) hypertension: Secondary | ICD-10-CM

## 2016-10-22 DIAGNOSIS — E785 Hyperlipidemia, unspecified: Secondary | ICD-10-CM

## 2016-10-22 MED ORDER — ATORVASTATIN CALCIUM 40 MG PO TABS: 40.0000 mg | ORAL_TABLET | Freq: Every day | ORAL | 2 refills | Status: DC

## 2016-10-22 MED ORDER — METFORMIN HCL ER 500 MG PO TB24: 500.0000 mg | ORAL_TABLET | Freq: Every day | ORAL | 2 refills | Status: DC

## 2016-10-22 MED ORDER — LOSARTAN POTASSIUM 50 MG PO TABS: 50.0000 mg | ORAL_TABLET | Freq: Every day | ORAL | 2 refills | Status: DC

## 2016-10-22 NOTE — Telephone Encounter (Signed)
Pt medication has been refilled and sent to mail order.

## 2016-11-06 ENCOUNTER — Telehealth: Payer: Self-pay

## 2016-11-06 NOTE — Telephone Encounter (Signed)
Called pt to schedule Medicare Annual Wellness Visit. -nr  

## 2016-11-11 DIAGNOSIS — L940 Localized scleroderma [morphea]: Secondary | ICD-10-CM | POA: Diagnosis not present

## 2016-11-28 ENCOUNTER — Ambulatory Visit (INDEPENDENT_AMBULATORY_CARE_PROVIDER_SITE_OTHER): Payer: Medicare HMO | Admitting: Family Medicine

## 2016-11-28 ENCOUNTER — Encounter: Payer: Self-pay | Admitting: Family Medicine

## 2016-11-28 VITALS — BP 140/68 | HR 77 | Temp 98.0°F | Resp 16 | Ht 66.93 in | Wt 186.0 lb

## 2016-11-28 DIAGNOSIS — J209 Acute bronchitis, unspecified: Secondary | ICD-10-CM

## 2016-11-28 MED ORDER — AZITHROMYCIN 250 MG PO TABS: ORAL_TABLET | ORAL | 0 refills | Status: DC

## 2016-11-28 NOTE — Progress Notes (Signed)
Chief Complaint  Patient presents with  . Cough    x 2 weeks   . Nasal Congestion    HPI  Upper Respiratory Infection: Patient complains of symptoms of a URI. Symptoms include congestion and cough. Onset of symptoms was 2 weeks ago, unchanged since that time. She also c/o congestion, nasal congestion, non productive cough, post nasal drip and wheezing for the past 2 weeks .  She is drinking plenty of fluids. Evaluation to date: none. Treatment to date: none.  She took cough meds and it had a decongestant in it   Past Medical History:  Diagnosis Date  . Diabetes mellitus without complication ([place])   . Hyperlipidemia   . Hypertension   . Osteoporosis     Current Outpatient Prescriptions  Medication Sig Dispense Refill  . alendronate (FOSAMAX) 70 MG tablet Take 70 mg by mouth once a week. Take on the same day each week    . aspirin 81 MG tablet Take 160 mg by mouth daily.    Marland Kitchen atorvastatin (LIPITOR) 40 MG tablet Take 1 tablet (40 mg total) by mouth daily. 90 tablet 2  . losartan (COZAAR) 50 MG tablet Take 1 tablet (50 mg total) by mouth daily. 90 tablet 2  . metFORMIN (GLUCOPHAGE-XR) 500 MG 24 hr tablet Take 1 tablet (500 mg total) by mouth daily. 90 tablet 2  . triamcinolone cream (KENALOG) 0.1 % Apply topically 2 (two) times daily as needed. 454 g 3  . azithromycin (ZITHROMAX) 250 MG tablet Take 2 tabs today then one tab each day after 6 tablet 0   No current facility-administered medications for this visit.     Allergies: No Known Allergies  Past Surgical History:  Procedure Laterality Date  . LUMBAR DISC SURGERY  9629   no complications  . SPINE SURGERY     lumbar  . TUBAL LIGATION      Social History   Social History  . Marital status: Married    Spouse name: Richard  . Number of children: 1  . Years of education: 73   Occupational History  . Mail Handler-Supervisor    Social History Main Topics  . Smoking status: Passive Smoke Exposure - Never Smoker  .  Smokeless tobacco: Never Used  . Alcohol use 0.0 - 0.6 oz/week     Comment: "every once in a while"  . Drug use: No  . Sexual activity: Yes    Birth control/ protection: None   Other Topics Concern  . None   Social History Narrative   Lives with husband.    ROS See hpi  Objective: Vitals:   11/28/16 1328 11/28/16 1329  BP: (!) 167/68 140/68  Pulse: 77   Resp: 16   Temp: 98 F (36.7 C)   TempSrc: Oral   SpO2: 96%   Weight: 186 lb (84.4 kg)   Height: 5' 6.93" (1.7 m)     Physical Exam General: alert, oriented, in NAD Head: normocephalic, atraumatic, no sinus tenderness Eyes: EOM intact, no scleral icterus or conjunctival injection Ears: TM clear bilaterally Nose: mucosa nonerythematous, nonedematous Throat: no pharyngeal exudate or erythema Lymph: no posterior auricular, submental or cervical lymph adenopathy Heart: normal rate, normal sinus rhythm, no murmurs Lungs: clear to auscultation bilaterally, no wheezing   Assessment and Plan Srija was seen today for cough and nasal congestion.  Diagnoses and all orders for this visit:  Bronchospasm with bronchitis, acute  Pt will return for vaccines when feeling better Pt will be treated  with zpak Advised to avoid meds with dextromorphan and phenylephrine -     azithromycin (ZITHROMAX) 250 MG tablet; Take 2 tabs today then one tab each day after     [place]

## 2016-11-28 NOTE — Patient Instructions (Addendum)
Antibiotic - You have received the antibiotic azithromycin. It may take several days before you feel any benefit from this medicine. Be sure to take all the medication prescribed, even if you are feeling better before it is finished. Do not drink alcohol while taking antibiotics.  It is unknown whether you will develop an adverse reaction (diarrhea, rash, nausea or vomiting) or an allergic reaction (trouble breathing, anaphylaxis).       IF you received an x-ray today, you will receive an invoice from [facility]. Please contact [facility] at [phone] with questions or concerns regarding your invoice.   IF you received labwork today, you will receive an invoice from [facility]. Please contact [facility] at [phone] with questions or concerns regarding your invoice.   Our billing staff will not be able to assist you with questions regarding bills from these companies.  You will be contacted with the lab results as soon as they are available. The fastest way to get your results is to activate your My Chart account. Instructions are located on the last page of this paperwork. If you have not heard from [place] regarding the results in 2 weeks, please contact this office.      Acute Bronchitis, Adult Acute bronchitis is sudden (acute) swelling of the air tubes (bronchi) in the lungs. Acute bronchitis causes these tubes to fill with mucus, which can make it hard to breathe. It can also cause coughing or wheezing. In adults, acute bronchitis usually goes away within 2 weeks. A cough caused by bronchitis may last up to 3 weeks. Smoking, allergies, and asthma can make the condition worse. Repeated episodes of bronchitis may cause further lung problems, such as chronic obstructive pulmonary disease (COPD). What are the causes? This condition can be caused by germs and by substances that irritate the lungs, including:  Cold and flu viruses. This condition is most often caused by the  same virus that causes a cold.  Bacteria.  Exposure to tobacco smoke, dust, fumes, and air pollution.  What increases the risk? This condition is more likely to develop in people who:  Have close contact with someone with acute bronchitis.  Are exposed to lung irritants, such as tobacco smoke, dust, fumes, and vapors.  Have a weak immune system.  Have a respiratory condition such as asthma.  What are the signs or symptoms? Symptoms of this condition include:  A cough.  Coughing up clear, yellow, or green mucus.  Wheezing.  Chest congestion.  Shortness of breath.  A fever.  Body aches.  Chills.  A sore throat.  How is this diagnosed? This condition is usually diagnosed with a physical exam. During the exam, your health care provider may order tests, such as chest X-rays, to rule out other conditions. He or she may also:  Test a sample of your mucus for bacterial infection.  Check the level of oxygen in your blood. This is done to check for pneumonia.  Do a chest X-ray or lung function testing to rule out pneumonia and other conditions.  Perform blood tests.  Your health care provider will also ask about your symptoms and medical history. How is this treated? Most cases of acute bronchitis clear up over time without treatment. Your health care provider may recommend:  Drinking more fluids. Drinking more makes your mucus thinner, which may make it easier to breathe.  Taking a medicine for a fever or cough.  Taking an antibiotic medicine.  Using an inhaler to help improve shortness of  breath and to control a cough.  Using a cool mist vaporizer or humidifier to make it easier to breathe.  Follow these instructions at home: Medicines  Take over-the-counter and prescription medicines only as told by your health care provider.  If you were prescribed an antibiotic, take it as told by your health care provider. Do not stop taking the antibiotic even if you  start to feel better. General instructions  Get plenty of rest.  Drink enough fluids to keep your urine clear or pale yellow.  Avoid smoking and secondhand smoke. Exposure to cigarette smoke or irritating chemicals will make bronchitis worse. If you smoke and you need help quitting, ask your health care provider. Quitting smoking will help your lungs heal faster.  Use an inhaler, cool mist vaporizer, or humidifier as told by your health care provider.  Keep all follow-up visits as told by your health care provider. This is important. How is this prevented? To lower your risk of getting this condition again:  Wash your hands often with soap and water. If soap and water are not available, use hand sanitizer.  Avoid contact with people who have cold symptoms.  Try not to touch your hands to your mouth, nose, or eyes.  Make sure to get the flu shot every year.  Contact a health care provider if:  Your symptoms do not improve in 2 weeks of treatment. Get help right away if:  You cough up blood.  You have chest pain.  You have severe shortness of breath.  You become dehydrated.  You faint or keep feeling like you are going to faint.  You keep vomiting.  You have a severe headache.  Your fever or chills gets worse. This information is not intended to replace advice given to you by your health care provider. Make sure you discuss any questions you have with your health care provider. Document Released: 04/02/2004 Document Revised: 09/18/2015 Document Reviewed: 08/14/2015 Elsevier Interactive Patient Education  2017 [employer].

## 2016-11-30 ENCOUNTER — Ambulatory Visit: Payer: Medicare HMO | Admitting: Family Medicine

## 2016-12-09 ENCOUNTER — Emergency Department (HOSPITAL_COMMUNITY): Payer: Medicare HMO

## 2016-12-09 ENCOUNTER — Encounter (HOSPITAL_COMMUNITY): Payer: Self-pay | Admitting: *Deleted

## 2016-12-09 ENCOUNTER — Emergency Department (HOSPITAL_COMMUNITY)
Admission: EM | Admit: 2016-12-09 | Discharge: 2016-12-09 | Disposition: A | Discharge status: Home / Self Care | Payer: Medicare HMO | Attending: Emergency Medicine | Admitting: Emergency Medicine

## 2016-12-09 DIAGNOSIS — Z7982 Long term (current) use of aspirin: Secondary | ICD-10-CM | POA: Diagnosis not present

## 2016-12-09 DIAGNOSIS — I10 Essential (primary) hypertension: Secondary | ICD-10-CM | POA: Diagnosis not present

## 2016-12-09 DIAGNOSIS — Z79899 Other long term (current) drug therapy: Secondary | ICD-10-CM | POA: Diagnosis not present

## 2016-12-09 DIAGNOSIS — E119 Type 2 diabetes mellitus without complications: Secondary | ICD-10-CM | POA: Diagnosis not present

## 2016-12-09 DIAGNOSIS — N201 Calculus of ureter: Secondary | ICD-10-CM | POA: Diagnosis not present

## 2016-12-09 DIAGNOSIS — Z7722 Contact with and (suspected) exposure to environmental tobacco smoke (acute) (chronic): Secondary | ICD-10-CM | POA: Insufficient documentation

## 2016-12-09 DIAGNOSIS — R1011 Right upper quadrant pain: Secondary | ICD-10-CM | POA: Diagnosis present

## 2016-12-09 DIAGNOSIS — R109 Unspecified abdominal pain: Secondary | ICD-10-CM | POA: Diagnosis not present

## 2016-12-09 LAB — CBC WITH DIFFERENTIAL/PLATELET
Basophils Absolute: 0 10*3/uL (ref 0.0–0.1)
Basophils Relative: 0 %
EOS PCT: 0 %
Eosinophils Absolute: 0 10*3/uL (ref 0.0–0.7)
HCT: 43.5 % (ref 36.0–46.0)
HEMOGLOBIN: 14.4 g/dL (ref 12.0–15.0)
Lymphocytes Relative: 5 %
Lymphs Abs: 0.6 10*3/uL — ABNORMAL LOW (ref 0.7–4.0)
MCH: 30.4 pg (ref 26.0–34.0)
MCHC: 33.1 g/dL (ref 30.0–36.0)
MCV: 92 fL (ref 78.0–100.0)
MONO ABS: 0.5 10*3/uL (ref 0.1–1.0)
MONOS PCT: 4 %
NEUTROS ABS: 11.4 10*3/uL — AB (ref 1.7–7.7)
Neutrophils Relative %: 91 %
PLATELETS: 218 10*3/uL (ref 150–400)
RBC: 4.73 MIL/uL (ref 3.87–5.11)
RDW: 13.2 % (ref 11.5–15.5)
WBC: 12.5 10*3/uL — ABNORMAL HIGH (ref 4.0–10.5)

## 2016-12-09 LAB — URINALYSIS, ROUTINE W REFLEX MICROSCOPIC
BILIRUBIN URINE: NEGATIVE
GLUCOSE, UA: NEGATIVE mg/dL
HGB URINE DIPSTICK: NEGATIVE
KETONES UR: NEGATIVE mg/dL
Leukocytes, UA: NEGATIVE
Nitrite: NEGATIVE
PH: 8 (ref 5.0–8.0)
PROTEIN: NEGATIVE mg/dL
Specific Gravity, Urine: 1.019 (ref 1.005–1.030)

## 2016-12-09 LAB — BASIC METABOLIC PANEL
ANION GAP: 9 (ref 5–15)
BUN: 22 mg/dL — AB (ref 6–20)
CALCIUM: 9.3 mg/dL (ref 8.9–10.3)
CO2: 25 mmol/L (ref 22–32)
CREATININE: 1.14 mg/dL — AB (ref 0.44–1.00)
Chloride: 105 mmol/L (ref 101–111)
GFR calc Af Amer: 56 mL/min — ABNORMAL LOW (ref >60)
GFR, EST NON AFRICAN AMERICAN: 48 mL/min — AB (ref >60)
GLUCOSE: 185 mg/dL — AB (ref 65–99)
Potassium: 5.6 mmol/L — ABNORMAL HIGH (ref 3.5–5.1)
Sodium: 139 mmol/L (ref 135–145)

## 2016-12-09 MED ORDER — ONDANSETRON HCL 4 MG/2ML IJ SOLN
4.0000 mg | Freq: Once | INTRAMUSCULAR | Status: AC
  Administered 2016-12-09: 4 mg via INTRAVENOUS
  Filled 2016-12-09: qty 2

## 2016-12-09 MED ORDER — OXYCODONE-ACETAMINOPHEN 5-325 MG PO TABS
1.0000 | ORAL_TABLET | Freq: Once | ORAL | Status: DC
  Filled 2016-12-09: qty 1

## 2016-12-09 MED ORDER — FENTANYL CITRATE (PF) 100 MCG/2ML IJ SOLN
50.0000 ug | Freq: Once | INTRAMUSCULAR | Status: AC
  Administered 2016-12-09: 50 ug via INTRAVENOUS
  Filled 2016-12-09: qty 2

## 2016-12-09 MED ORDER — KETOROLAC TROMETHAMINE 30 MG/ML IJ SOLN
15.0000 mg | Freq: Once | INTRAMUSCULAR | Status: AC
  Administered 2016-12-09: 15 mg via INTRAVENOUS
  Filled 2016-12-09: qty 1

## 2016-12-09 MED ORDER — ONDANSETRON 4 MG PO TBDP: 4.0000 mg | ORAL_TABLET | Freq: Three times a day (TID) | ORAL | 0 refills | Status: DC | PRN

## 2016-12-09 MED ORDER — OXYCODONE-ACETAMINOPHEN 5-325 MG PO TABS: 1.0000 | ORAL_TABLET | Freq: Four times a day (QID) | ORAL | 0 refills | Status: DC | PRN

## 2016-12-09 NOTE — ED Triage Notes (Signed)
Patient is alert and oriented x4.  She is being seen for left side flank pain that started early this morning.  Patient has a Hx of kidney stones and states that it feels like another one.  Currently she rates her pain 10 of 10 with nausea and vomiting.

## 2016-12-09 NOTE — ED Provider Notes (Signed)
[facility] DEPT Provider Note   CSN: [id number] Arrival date & time: 12/09/16  4010     History   Chief Complaint Chief Complaint  Patient presents with  . Flank Pain    HPI Kristen Pratt is a 71 y.o. female.  HPI Patient presents with right flank pain. Began last night. States it feels previous kidney stones. Last kidney stone however was years ago. Has urinary frequency and hesitancy. No fevers. Pain is sharp in her right flank. Has had occasional cough. No chest pain. No abdominal pain. Past Medical History:  Diagnosis Date  . Diabetes mellitus without complication ([place])   . Hyperlipidemia   . Hypertension   . Osteoporosis     Patient Active Problem List   Diagnosis Date Noted  . Osteoporosis 07/14/2016  . Type 2 diabetes mellitus without complication, without long-term current use of insulin ([employer]) 06/20/2016  . Actinic keratoses 06/20/2016  . Eczema, dyshidrotic 02/20/2012  . Hyperlipidemia 04/12/2011  . Hypertension 04/12/2011    Past Surgical History:  Procedure Laterality Date  . LUMBAR DISC SURGERY  2725   no complications  . SPINE SURGERY     lumbar  . TUBAL LIGATION      OB History    No data available       Home Medications    Prior to Admission medications   Medication Sig Start Date End Date Taking? Authorizing Provider  aspirin 81 MG tablet Take 160 mg by mouth daily.   Yes [provider]  atorvastatin (LIPITOR) 40 MG tablet Take 1 tablet (40 mg total) by mouth daily. 10/22/16  Yes Delia Chimes A, MD  losartan (COZAAR) 50 MG tablet Take 1 tablet (50 mg total) by mouth daily. 10/22/16  Yes Delia Chimes A, MD  metFORMIN (GLUCOPHAGE-XR) 500 MG 24 hr tablet Take 1 tablet (500 mg total) by mouth daily. 10/22/16  Yes Stallings, Zoe A, MD  triamcinolone cream (KENALOG) 0.1 % APPLY  CREAM EXTERNALLY TO AFFECTED AREA ONCE DAILY AS NEEDED 06/11/16  Yes [provider]  alendronate (FOSAMAX) 70 MG tablet Take 70 mg by mouth once  a week. Take on the same day each week 10/28/15   [provider]  azithromycin (ZITHROMAX) 250 MG tablet Take 2 tabs today then one tab each day after Patient not taking: Reported on 12/09/2016 11/28/16   Forrest Moron, MD  ondansetron (ZOFRAN-ODT) 4 MG disintegrating tablet Take 1 tablet (4 mg total) by mouth every 8 (eight) hours as needed for nausea or vomiting. 12/09/16   Davonna Belling, MD  oxyCODONE-acetaminophen (PERCOCET/ROXICET) 5-325 MG tablet Take 1-2 tablets by mouth every 6 (six) hours as needed for severe pain. 12/09/16   Davonna Belling, MD  triamcinolone cream (KENALOG) 0.1 % Apply topically 2 (two) times daily as needed. Patient not taking: Reported on 12/09/2016 05/15/13   Copland, Gay Filler, MD    Family History Family History  Problem Relation Age of Onset  . Stroke Mother     Social History Social History  Substance Use Topics  . Smoking status: Passive Smoke Exposure - Never Smoker  . Smokeless tobacco: Never Used  . Alcohol use 0.0 - 0.6 oz/week     Comment: "every once in a while"     Allergies   Patient has no known allergies.   Review of Systems Review of Systems  Constitutional: Negative for appetite change.  HENT: Negative for congestion.   Respiratory: Negative for shortness of breath.   Cardiovascular: Negative for chest  pain.  Gastrointestinal: Negative for abdominal pain.  Genitourinary: Positive for dysuria and flank pain.  Musculoskeletal: Negative for back pain.  Skin: Negative for rash.  Neurological: Negative for numbness.  Hematological: Negative for adenopathy.  Psychiatric/Behavioral: Negative for confusion.     Physical Exam Updated Vital Signs BP (!) 176/63 (BP Location: Left Arm)   Pulse 76   Temp 97.6 F (36.4 C) (Oral)   Resp 17   Ht 5\' 7"  (1.702 m)   Wt 84.4 kg (186 lb)   SpO2 99%   BMI 29.13 kg/m   Physical Exam  Constitutional: She appears well-developed.  HENT:  Head: Atraumatic.  Neck: Neck  supple.  Cardiovascular: Normal rate.   Pulmonary/Chest: Effort normal. She has no wheezes. She has no rales.  Abdominal: There is no tenderness.  Genitourinary:  Genitourinary Comments: CVA tenderness on right side  Musculoskeletal: She exhibits no edema.  Neurological: She is alert.  Skin: Skin is warm. Capillary refill takes less than 2 seconds.     ED Treatments / Results  Labs (all labs ordered are listed, but only abnormal results are displayed) Labs Reviewed  BASIC METABOLIC PANEL - Abnormal; Notable for the following:       Result Value   Potassium 5.6 (*)    Glucose, Bld 185 (*)    BUN 22 (*)    Creatinine, Ser 1.14 (*)    GFR calc non Af Amer 48 (*)    GFR calc Af Amer 56 (*)    All other components within normal limits  CBC WITH DIFFERENTIAL/PLATELET - Abnormal; Notable for the following:    WBC 12.5 (*)    Neutro Abs 11.4 (*)    Lymphs Abs 0.6 (*)    All other components within normal limits  URINALYSIS, ROUTINE W REFLEX MICROSCOPIC    EKG  EKG Interpretation None       Radiology Ct Renal Stone Study  Result Date: 12/09/2016 CLINICAL DATA:  71 year old female with right flank pain since 0200 hours. EXAM: CT ABDOMEN AND PELVIS WITHOUT CONTRAST TECHNIQUE: Multidetector CT imaging of the abdomen and pelvis was performed following the standard protocol without IV contrast. COMPARISON:  CT Abdomen and Pelvis 06/10/2012. FINDINGS: Lower chest: Mild lung base atelectasis. Chronic mild elevation of the left hemidiaphragm. Borderline to mild cardiomegaly. No pericardial or pleural effusion. Hepatobiliary: Negative noncontrast liver and gallbladder. Pancreas: Negative. Spleen: Negative. Adrenals/Urinary Tract: Normal adrenal glands. Negative noncontrast left kidney and course of the left ureter. Mild to moderate right hydronephrosis and right pararenal space stranding. Right hydroureter with periureteral stranding to the level of the ureterovesical junction where a now  oblong 4-5 mm calculus is present (series 2, image 82). Decompressed urinary bladder. No intrarenal calculi or other urologic calculus identified. Stomach/Bowel: Negative rectum. Minimal sigmoid diverticula. Decompressed descending and distal colon. Negative transverse colon. Mildly redundant hepatic flexure. Negative right colon and appendix. Negative terminal ileum. No dilated small bowel. Negative stomach and duodenum. No abdominal free fluid. Vascular/Lymphatic: Calcified aortic atherosclerosis. Vascular patency is not evaluated in the absence of IV contrast. No lymphadenopathy. Reproductive: Negative. Other: No pelvic free fluid. Musculoskeletal: No acute osseous abnormality identified. IMPRESSION: 1. Acute obstructive uropathy on the right with a 4-5 mm calculus at the UVJ. Possible mild forniceal rupture. 2. No other urologic calculus. 3. Mild Calcified Aortic atherosclerosis. Electronically Signed   By: Genevie Ann M.D.   On: 12/09/2016 10:52    Procedures Procedures (including critical care time)  Medications Ordered in ED Medications  oxyCODONE-acetaminophen (PERCOCET/ROXICET) 5-325 MG per tablet 1 tablet (0 tablets Oral Hold 12/09/16 1245)  ondansetron (ZOFRAN) injection 4 mg (4 mg Intravenous Given 12/09/16 0913)  fentaNYL (SUBLIMAZE) injection 50 mcg (50 mcg Intravenous Given 12/09/16 0914)  fentaNYL (SUBLIMAZE) injection 50 mcg (50 mcg Intravenous Given 12/09/16 1114)  ondansetron (ZOFRAN) injection 4 mg (4 mg Intravenous Given 12/09/16 1113)  ketorolac (TORADOL) 30 MG/ML injection 15 mg (15 mg Intravenous Given 12/09/16 1216)     Initial Impression / Assessment and Plan / ED Course  I have reviewed the triage vital signs and the nursing notes.  Pertinent labs & imaging results that were available during my care of the patient were reviewed by me and considered in my medical decision making (see chart for details).     Patient was flank pain. Found to have distal ureteral stone.  Creatinine minimally elevated. Urine does not show infection. Pain improved after treatment. Discussed with Dr. Diona Fanti and Toradol given. Pain much improved. Discharged home to follow-up with urology.  Final Clinical Impressions(s) / ED Diagnoses   Final diagnoses:  Right ureteral stone    New Prescriptions New Prescriptions   ONDANSETRON (ZOFRAN-ODT) 4 MG DISINTEGRATING TABLET    Take 1 tablet (4 mg total) by mouth every 8 (eight) hours as needed for nausea or vomiting.   OXYCODONE-ACETAMINOPHEN (PERCOCET/ROXICET) 5-325 MG TABLET    Take 1-2 tablets by mouth every 6 (six) hours as needed for severe pain.     Davonna Belling, MD 12/09/16 1322

## 2016-12-23 ENCOUNTER — Ambulatory Visit (INDEPENDENT_AMBULATORY_CARE_PROVIDER_SITE_OTHER): Payer: Medicare HMO | Admitting: Family Medicine

## 2016-12-23 ENCOUNTER — Encounter: Payer: Self-pay | Admitting: Family Medicine

## 2016-12-23 VITALS — BP 134/62 | HR 71 | Temp 98.2°F | Resp 16 | Ht 67.0 in | Wt 186.0 lb

## 2016-12-23 DIAGNOSIS — E119 Type 2 diabetes mellitus without complications: Secondary | ICD-10-CM

## 2016-12-23 DIAGNOSIS — M810 Age-related osteoporosis without current pathological fracture: Secondary | ICD-10-CM

## 2016-12-23 DIAGNOSIS — Z760 Encounter for issue of repeat prescription: Secondary | ICD-10-CM

## 2016-12-23 DIAGNOSIS — I10 Essential (primary) hypertension: Secondary | ICD-10-CM | POA: Diagnosis not present

## 2016-12-23 DIAGNOSIS — Z7983 Long term (current) use of bisphosphonates: Secondary | ICD-10-CM

## 2016-12-23 LAB — POCT GLYCOSYLATED HEMOGLOBIN (HGB A1C): Hemoglobin A1C: 6.4

## 2016-12-23 NOTE — Progress Notes (Addendum)
Chief Complaint  Patient presents with  . 3 month f/u    diabetes, cholesterols    HPI   Diabetes Mellitus: Patient presents for follow up of diabetes. Symptoms: none. Symptoms have been well-controlled. Patient denies foot ulcerations, hyperglycemia, hypoglycemia , increase appetite, nausea, paresthesia of the feet, polydipsia, polyuria, visual disturbances, vomitting and weight loss.  Evaluation to date has been included: fasting blood sugar.  Home sugars: BGs consistently in an acceptable range. Treatment to date: Continued metformin which has been effective.  Lab Results  Component Value Date   HGBA1C 6.7 06/20/2016    Hypertension: Patient here for follow-up of elevated blood pressure. She is exercising and is adherent to low salt diet.  Blood pressure is well controlled at home. Cardiac symptoms none. Patient denies chest pain, claudication, dyspnea, fatigue, irregular heart beat, lower extremity edema, orthopnea and palpitations.  Cardiovascular risk factors: diabetes mellitus and hypertension. Use of agents associated with hypertension: none. History of target organ damage: none. BP Readings from Last 3 Encounters:  12/23/16 134/62  12/09/16 (!) 157/66  11/28/16 140/68   Osteoporosis Osteoporosis: Patient complains of osteoporosis. She was diagnosed with osteoporosis by bone density scan in 2015. Patient denies history of fracture.The cause of osteoporosis is felt to be due to postmenopausal estrogen deficiency.   She is currently being treated with calcium and vitamin D supplementation.  She is currently being treated with bisphosphonates   Osteoporosis Risk Factors  Nonmodifiable Personal Hx of fracture as an adult: no Hx of fracture in first-degree relative: no Caucasian race: yes Advanced age: yes Female sex: yes Dementia: no Poor health/frailty: no  Potentially modifiable: Tobacco use: no Low body weight (<127 lbs): no Estrogen deficiency  early menopause (age  <45) or bilateral ovariectomy: no  prolonged premenopausal amenorrhea (>1 yr): no Low calcium intake (lifelong): no Alcoholism: no Recurrent falls: no Inadequate physical activity: no    Past Medical History:  Diagnosis Date  . Diabetes mellitus without complication ([place])   . Hyperlipidemia   . Hypertension   . Osteoporosis     Current Outpatient Prescriptions  Medication Sig Dispense Refill  . alendronate (FOSAMAX) 70 MG tablet Take 70 mg by mouth once a week. Take on the same day each week    . aspirin 81 MG tablet Take 160 mg by mouth daily.    Marland Kitchen atorvastatin (LIPITOR) 40 MG tablet Take 1 tablet (40 mg total) by mouth daily. 90 tablet 2  . losartan (COZAAR) 50 MG tablet Take 1 tablet (50 mg total) by mouth daily. 90 tablet 2  . metFORMIN (GLUCOPHAGE-XR) 500 MG 24 hr tablet Take 1 tablet (500 mg total) by mouth daily. 90 tablet 2  . azithromycin (ZITHROMAX) 250 MG tablet Take 2 tabs today then one tab each day after (Patient not taking: Reported on 12/23/2016) 6 tablet 0  . ondansetron (ZOFRAN-ODT) 4 MG disintegrating tablet Take 1 tablet (4 mg total) by mouth every 8 (eight) hours as needed for nausea or vomiting. (Patient not taking: Reported on 12/23/2016) 8 tablet 0  . oxyCODONE-acetaminophen (PERCOCET/ROXICET) 5-325 MG tablet Take 1-2 tablets by mouth every 6 (six) hours as needed for severe pain. (Patient not taking: Reported on 12/23/2016) 10 tablet 0  . triamcinolone cream (KENALOG) 0.1 % Apply topically 2 (two) times daily as needed. (Patient not taking: Reported on 12/09/2016) 454 g 3  . triamcinolone cream (KENALOG) 0.1 % APPLY  CREAM EXTERNALLY TO AFFECTED AREA ONCE DAILY AS NEEDED     No current facility-administered  medications for this visit.     Allergies: No Known Allergies  Past Surgical History:  Procedure Laterality Date  . LUMBAR DISC SURGERY  3825   no complications  . SPINE SURGERY     lumbar  . TUBAL LIGATION      Social History   Social  History  . Marital status: Married    Spouse name: Richard  . Number of children: 1  . Years of education: 83   Occupational History  . Mail Handler-Supervisor    Social History Main Topics  . Smoking status: Passive Smoke Exposure - Never Smoker  . Smokeless tobacco: Never Used  . Alcohol use 0.0 - 0.6 oz/week     Comment: "every once in a while"  . Drug use: No  . Sexual activity: Yes    Birth control/ protection: None   Other Topics Concern  . None   Social History Narrative   Lives with husband.    Review of Systems  Constitutional: Negative for chills, fever and weight loss.  Respiratory: Negative for cough, shortness of breath and wheezing.   Cardiovascular: Negative for chest pain and palpitations.  Gastrointestinal: Negative for abdominal pain, nausea and vomiting.  Neurological: Negative for dizziness, tingling and headaches.    Objective: Vitals:   12/23/16 0843  BP: 134/62  Pulse: 71  Resp: 16  Temp: 98.2 F (36.8 C)  TempSrc: Oral  SpO2: 95%  Weight: 186 lb (84.4 kg)  Height: 5\' 7"  (1.702 m)    Physical Exam  Constitutional: She is oriented to person, place, and time. She appears well-developed and well-nourished.  HENT:  Head: Normocephalic and atraumatic.  No TMJ clicking or clunking  Eyes: Conjunctivae and EOM are normal.  Cardiovascular: Normal rate, regular rhythm and normal heart sounds.   No murmur heard. Pulmonary/Chest: Effort normal and breath sounds normal. No respiratory distress. She has no wheezes.  Musculoskeletal: Normal range of motion. She exhibits no edema.  Neurological: She is alert and oriented to person, place, and time.  Psychiatric: She has a normal mood and affect. Her behavior is normal. Judgment and thought content normal.    Assessment and Plan Halena was seen today for 3 month f/u.  Diagnoses and all orders for this visit:  Controlled type 2 diabetes mellitus without complication, without long-term current  use of insulin ([place])- pt on metformin  Continue exercise and ADA diet Monitoring every six months Vaccines up to date -     POCT glycosylated hemoglobin (Hb A1C)  Essential hypertension- bp in good range, well controlled, pt adhering to DASH diet  Encounter for medication refill- due for refill of foxamax  Age related osteoporosis and Long term use of alendronate therapy (Fosamax)- pt on year 3 of her fosamax treatment  Advised follow up with the dentist to keep an eye on her jaw  Other orders -     Cancel: Flu Vaccine QUAD 36+ mos IM -     Cancel: MM Digital Screening; Future  -  6 months follow up for wellness visit -  Discussed that in April 2019 she will need referral for bone density. She will call to get mammogram    [facility]

## 2016-12-23 NOTE — Patient Instructions (Addendum)
   IF you received an x-ray today, you will receive an invoice from [facility]. Please contact [facility] at [phone] with questions or concerns regarding your invoice.   IF you received labwork today, you will receive an invoice from [facility]. Please contact [facility] at [phone] with questions or concerns regarding your invoice.   Our billing staff will not be able to assist you with questions regarding bills from these companies.  You will be contacted with the lab results as soon as they are available. The fastest way to get your results is to activate your My Chart account. Instructions are located on the last page of this paperwork. If you have not heard from us regarding the results in 2 weeks, please contact this office.      Diabetes Mellitus and Standards of Medical Care Managing diabetes (diabetes mellitus) can be complicated. Your diabetes treatment may be managed by a team of health care providers, including:  A diet and nutrition specialist (registered dietitian).  A nurse.  A certified diabetes educator (CDE).  A diabetes specialist (endocrinologist).  An eye doctor.  A primary care provider.  A dentist.  Your health care providers follow a schedule in order to help you get the best quality of care. The following schedule is a general guideline for your diabetes management plan. Your health care providers may also give you more specific instructions. HbA1c ( hemoglobin A1c) test This test provides information about blood sugar (glucose) control over the previous 2-3 months. It is used to check whether your diabetes management plan needs to be adjusted.  If you are meeting your treatment goals, this test is done at least 2 times a year.  If you are not meeting treatment goals or if your treatment goals have changed, this test is done 4 times a year.  Blood pressure test  This test is done at every routine medical visit. For most  people, the goal is less than 130/80. Ask your health care provider what your goal blood pressure should be. Dental and eye exams  Visit your dentist two times a year.  If you have type 1 diabetes, get an eye exam 3-5 years after you are diagnosed, and then once a year after your first exam. ? If you were diagnosed with type 1 diabetes as a child, get an eye exam when you are age 10 or older and have had diabetes for 3-5 years. After the first exam, you should get an eye exam once a year.  If you have type 2 diabetes, have an eye exam as soon as you are diagnosed, and then once a year after your first exam. Foot care exam  Visual foot exams are done at every routine medical visit. The exams check for cuts, bruises, redness, blisters, sores, or other problems with the feet.  A complete foot exam is done by your health care provider once a year. This exam includes an inspection of the structure and skin of your feet, and a check of the pulses and sensation in your feet. ? Type 1 diabetes: Get your first exam 3-5 years after diagnosis. ? Type 2 diabetes: Get your first exam as soon as you are diagnosed.  Check your feet every day for cuts, bruises, redness, blisters, or sores. If you have any of these or other problems that are not healing, contact your health care provider. Kidney function test ( urine microalbumin)  This test is done once a year. ? Type 1 diabetes:   Get your first test 5 years after diagnosis. ? Type 2 diabetes: Get your first test as soon as you are diagnosed.  If you have chronic kidney disease (CKD), get a serum creatinine and estimated glomerular filtration rate (eGFR) test once a year. Lipid profile (cholesterol, HDL, LDL, triglycerides)  This test should be done when you are diagnosed with diabetes, and every 5 years after the first test. If you are on medicines to lower your cholesterol, you may need to get this test done every year. ? The goal for LDL is less than  100 mg/dL (5.5 mmol/L). If you are at high risk, the goal is less than 70 mg/dL (3.9 mmol/L). ? The goal for HDL is 40 mg/dL (2.2 mmol/L) for men and 50 mg/dL(2.8 mmol/L) for women. An HDL cholesterol of 60 mg/dL (3.3 mmol/L) or higher gives some protection against heart disease. ? The goal for triglycerides is less than 150 mg/dL (8.3 mmol/L). Immunizations  The yearly flu (influenza) vaccine is recommended for everyone 6 months or older who has diabetes.  The pneumonia (pneumococcal) vaccine is recommended for everyone 2 years or older who has diabetes. If you are 65 or older, you may get the pneumonia vaccine as a series of two separate shots.  The hepatitis B vaccine is recommended for adults shortly after they have been diagnosed with diabetes.  The Tdap (tetanus, diphtheria, and pertussis) vaccine should be given: ? According to normal childhood vaccination schedules, for children. ? Every 10 years, for adults who have diabetes.  The shingles vaccine is recommended for people who have had chicken pox and are 50 years or older. Mental and emotional health  Screening for symptoms of eating disorders, anxiety, and depression is recommended at the time of diagnosis and afterward as needed. If your screening shows that you have symptoms (you have a positive screening result), you may need further evaluation and be referred to a mental health care provider. Diabetes self-management education  Education about how to manage your diabetes is recommended at diagnosis and ongoing as needed. Treatment plan  Your treatment plan will be reviewed at every medical visit. Summary  Managing diabetes (diabetes mellitus) can be complicated. Your diabetes treatment may be managed by a team of health care providers.  Your health care providers follow a schedule in order to help you get the best quality of care.  Standards of care including having regular physical exams, blood tests, blood pressure  monitoring, immunizations, screening tests, and education about how to manage your diabetes.  Your health care providers may also give you more specific instructions based on your individual health. This information is not intended to replace advice given to you by your health care provider. Make sure you discuss any questions you have with your health care provider. Document Released: 12/21/2008 Document Revised: 11/22/2015 Document Reviewed: 11/22/2015 Elsevier Interactive Patient Education  2018 [employer]vier Inc.  

## 2016-12-24 ENCOUNTER — Encounter: Payer: Self-pay | Admitting: Family Medicine

## 2016-12-24 LAB — LIPID PANEL
CHOL/HDL RATIO: 4 ratio (ref 0.0–4.4)
CHOLESTEROL TOTAL: 135 mg/dL (ref 100–199)
HDL: 34 mg/dL — ABNORMAL LOW (ref >39)
LDL CALC: 84 mg/dL (ref 0–99)
TRIGLYCERIDES: 85 mg/dL (ref 0–149)
VLDL CHOLESTEROL CAL: 17 mg/dL (ref 5–40)

## 2016-12-24 LAB — COMPREHENSIVE METABOLIC PANEL
ALK PHOS: 98 IU/L (ref 39–117)
ALT: 14 IU/L (ref 0–32)
AST: 23 IU/L (ref 0–40)
Albumin/Globulin Ratio: 1.6 (ref 1.2–2.2)
Albumin: 4.5 g/dL (ref 3.6–4.8)
BUN / CREAT RATIO: 19 (ref 12–28)
BUN: 16 mg/dL (ref 8–27)
Bilirubin Total: 0.4 mg/dL (ref 0.0–1.2)
CO2: 23 mmol/L (ref 20–29)
CREATININE: 0.83 mg/dL (ref 0.57–1.00)
Calcium: 10 mg/dL (ref 8.7–10.3)
Chloride: 105 mmol/L (ref 96–106)
GFR calc Af Amer: 83 mL/min/{1.73_m2} (ref >59)
GFR, EST NON AFRICAN AMERICAN: 72 mL/min/{1.73_m2} (ref >59)
GLUCOSE: 109 mg/dL — AB (ref 65–99)
Globulin, Total: 2.8 g/dL (ref 1.5–4.5)
Potassium: 4.3 mmol/L (ref 3.5–5.2)
Sodium: 146 mmol/L — ABNORMAL HIGH (ref 134–144)
TOTAL PROTEIN: 7.3 g/dL (ref 6.0–8.5)

## 2017-01-25 ENCOUNTER — Telehealth: Payer: Self-pay | Admitting: Family Medicine

## 2017-01-25 NOTE — Telephone Encounter (Signed)
Please advise 

## 2017-01-25 NOTE — Telephone Encounter (Signed)
Copied from [id number] [id number]. Topic: Quick Communication - See Telephone Encounter >> Jan 25, 2017 10:52 AM Corie Chiquito, [place] wrote: CRM for notification. See Telephone encounter for: Patient needs a refill on her Alendronate. Please send to [facility] on [facility]  01/25/17.

## 2017-01-25 NOTE — Telephone Encounter (Signed)
It looks like provider did not write for medication originally- does she want to continue it?

## 2017-01-29 MED ORDER — ALENDRONATE SODIUM 70 MG PO TABS: 70.0000 mg | ORAL_TABLET | ORAL | 3 refills | Status: DC

## 2017-01-29 NOTE — Telephone Encounter (Signed)
Medication sent in. 

## 2017-05-31 ENCOUNTER — Other Ambulatory Visit: Payer: Self-pay | Admitting: Family Medicine

## 2017-05-31 DIAGNOSIS — I10 Essential (primary) hypertension: Secondary | ICD-10-CM

## 2017-05-31 DIAGNOSIS — E785 Hyperlipidemia, unspecified: Secondary | ICD-10-CM

## 2017-06-21 ENCOUNTER — Ambulatory Visit: Payer: Medicare HMO

## 2017-06-22 ENCOUNTER — Ambulatory Visit: Payer: Medicare HMO

## 2017-06-22 ENCOUNTER — Ambulatory Visit (INDEPENDENT_AMBULATORY_CARE_PROVIDER_SITE_OTHER): Payer: Medicare HMO

## 2017-06-22 VITALS — BP 136/72 | HR 81 | Temp 98.2°F | Ht 67.0 in | Wt 190.1 lb

## 2017-06-22 DIAGNOSIS — E119 Type 2 diabetes mellitus without complications: Secondary | ICD-10-CM

## 2017-06-22 DIAGNOSIS — I10 Essential (primary) hypertension: Secondary | ICD-10-CM

## 2017-06-22 DIAGNOSIS — E785 Hyperlipidemia, unspecified: Secondary | ICD-10-CM

## 2017-06-22 DIAGNOSIS — Z0000 Encounter for general adult medical examination without abnormal findings: Secondary | ICD-10-CM

## 2017-06-22 NOTE — Patient Instructions (Addendum)
Kristen Pratt , Thank you for taking time to come for your Medicare Wellness Visit. I appreciate your ongoing commitment to your health goals. Please review the following plan we discussed and let me know if I can assist you in the future.   Screening recommendations/referrals: Colonoscopy: up to date, next due 03/09/2018 Mammogram: up to date, next due 07/03/17, you will call and schedule this yourself, you stated Bone Density: up to date, next due 07/04/2018 Recommended yearly ophthalmology/optometry visit for glaucoma screening and checkup Recommended yearly dental visit for hygiene and checkup  Vaccinations: Influenza vaccine: up to date Pneumococcal vaccine: up to date Tdap vaccine: up to date, next due 05/16/2023 Shingles vaccine: Discussed Shingrix    Advanced directives: Advance directive discussed with you today. I have provided a copy for you to complete at home and have notarized. Once this is complete please bring a copy in to our office so we can scan it into your chart.   Conditions/risks identified: Try to lose some weight and get to around 180 lbs.  Next appointment: schedule follow up visit with PCP, 1 year for next AWV   Preventive Care 65 Years and Older, Female Preventive care refers to lifestyle choices and visits with your health care provider that can promote health and wellness. What does preventive care include?  A yearly physical exam. This is also called an annual well check.  Dental exams once or twice a year.  Routine eye exams. Ask your health care provider how often you should have your eyes checked.  Personal lifestyle choices, including:  Daily care of your teeth and gums.  Regular physical activity.  Eating a healthy diet.  Avoiding tobacco and drug use.  Limiting alcohol use.  Practicing safe sex.  Taking low-dose aspirin every day.  Taking vitamin and mineral supplements as recommended by your health care provider. What happens during an  annual well check? The services and screenings done by your health care provider during your annual well check will depend on your age, overall health, lifestyle risk factors, and family history of disease. Counseling  Your health care provider may ask you questions about your:  Alcohol use.  Tobacco use.  Drug use.  Emotional well-being.  Home and relationship well-being.  Sexual activity.  Eating habits.  History of falls.  Memory and ability to understand (cognition).  Work and work Statistician.  Reproductive health. Screening  You may have the following tests or measurements:  Height, weight, and BMI.  Blood pressure.  Lipid and cholesterol levels. These may be checked every 5 years, or more frequently if you are over 36 years old.  Skin check.  Lung cancer screening. You may have this screening every year starting at age 19 if you have a 30-pack-year history of smoking and currently smoke or have quit within the past 15 years.  Fecal occult blood test (FOBT) of the stool. You may have this test every year starting at age 66.  Flexible sigmoidoscopy or colonoscopy. You may have a sigmoidoscopy every 5 years or a colonoscopy every 10 years starting at age 49.  Hepatitis C blood test.  Hepatitis B blood test.  Sexually transmitted disease (STD) testing.  Diabetes screening. This is done by checking your blood sugar (glucose) after you have not eaten for a while (fasting). You may have this done every 1-3 years.  Bone density scan. This is done to screen for osteoporosis. You may have this done starting at age 62.  Mammogram. This may  be done every 1-2 years. Talk to your health care provider about how often you should have regular mammograms. Talk with your health care provider about your test results, treatment options, and if necessary, the need for more tests. Vaccines  Your health care provider may recommend certain vaccines, such as:  Influenza  vaccine. This is recommended every year.  Tetanus, diphtheria, and acellular pertussis (Tdap, Td) vaccine. You may need a Td booster every 10 years.  Zoster vaccine. You may need this after age 45.  Pneumococcal 13-valent conjugate (PCV13) vaccine. One dose is recommended after age 42.  Pneumococcal polysaccharide (PPSV23) vaccine. One dose is recommended after age 45. Talk to your health care provider about which screenings and vaccines you need and how often you need them. This information is not intended to replace advice given to you by your health care provider. Make sure you discuss any questions you have with your health care provider. Document Released: 03/22/2015 Document Revised: 11/13/2015 Document Reviewed: 12/25/2014 Elsevier Interactive Patient Education  2017 [employer] Prevention in the Home Falls can cause injuries. They can happen to people of all ages. There are many things you can do to make your home safe and to help prevent falls. What can I do on the outside of my home?  Regularly fix the edges of walkways and driveways and fix any cracks.  Remove anything that might make you trip as you walk through a door, such as a raised step or threshold.  Trim any bushes or trees on the path to your home.  Use bright outdoor lighting.  Clear any walking paths of anything that might make someone trip, such as rocks or tools.  Regularly check to see if handrails are loose or broken. Make sure that both sides of any steps have handrails.  Any raised decks and porches should have guardrails on the edges.  Have any leaves, snow, or ice cleared regularly.  Use sand or salt on walking paths during winter.  Clean up any spills in your garage right away. This includes oil or grease spills. What can I do in the bathroom?  Use night lights.  Install grab bars by the toilet and in the tub and shower. Do not use towel bars as grab bars.  Use non-skid mats or decals  in the tub or shower.  If you need to sit down in the shower, use a plastic, non-slip stool.  Keep the floor dry. Clean up any water that spills on the floor as soon as it happens.  Remove soap buildup in the tub or shower regularly.  Attach bath mats securely with double-sided non-slip rug tape.  Do not have throw rugs and other things on the floor that can make you trip. What can I do in the bedroom?  Use night lights.  Make sure that you have a light by your bed that is easy to reach.  Do not use any sheets or blankets that are too big for your bed. They should not hang down onto the floor.  Have a firm chair that has side arms. You can use this for support while you get dressed.  Do not have throw rugs and other things on the floor that can make you trip. What can I do in the kitchen?  Clean up any spills right away.  Avoid walking on wet floors.  Keep items that you use a lot in easy-to-reach places.  If you need to reach something above you,  use a strong step stool that has a grab bar.  Keep electrical cords out of the way.  Do not use floor polish or wax that makes floors slippery. If you must use wax, use non-skid floor wax.  Do not have throw rugs and other things on the floor that can make you trip. What can I do with my stairs?  Do not leave any items on the stairs.  Make sure that there are handrails on both sides of the stairs and use them. Fix handrails that are broken or loose. Make sure that handrails are as long as the stairways.  Check any carpeting to make sure that it is firmly attached to the stairs. Fix any carpet that is loose or worn.  Avoid having throw rugs at the top or bottom of the stairs. If you do have throw rugs, attach them to the floor with carpet tape.  Make sure that you have a light switch at the top of the stairs and the bottom of the stairs. If you do not have them, ask someone to add them for you. What else can I do to help  prevent falls?  Wear shoes that:  Do not have high heels.  Have rubber bottoms.  Are comfortable and fit you well.  Are closed at the toe. Do not wear sandals.  If you use a stepladder:  Make sure that it is fully opened. Do not climb a closed stepladder.  Make sure that both sides of the stepladder are locked into place.  Ask someone to hold it for you, if possible.  Clearly mark and make sure that you can see:  Any grab bars or handrails.  First and last steps.  Where the edge of each step is.  Use tools that help you move around (mobility aids) if they are needed. These include:  Canes.  Walkers.  Scooters.  Crutches.  Turn on the lights when you go into a dark area. Replace any light bulbs as soon as they burn out.  Set up your furniture so you have a clear path. Avoid moving your furniture around.  If any of your floors are uneven, fix them.  If there are any pets around you, be aware of where they are.  Review your medicines with your doctor. Some medicines can make you feel dizzy. This can increase your chance of falling. Ask your doctor what other things that you can do to help prevent falls. This information is not intended to replace advice given to you by your health care provider. Make sure you discuss any questions you have with your health care provider. Document Released: 12/20/2008 Document Revised: 08/01/2015 Document Reviewed: 03/30/2014 Elsevier Interactive Patient Education  2017 [employer].

## 2017-06-22 NOTE — Progress Notes (Signed)
Subjective:   Kristen Pratt is a 72 y.o. female who presents for Medicare Annual (Subsequent) preventive examination.  Review of Systems:  N/A Cardiac Risk Factors include: advanced age (>32men, >27 women);diabetes mellitus;hypertension;dyslipidemia     Objective:     Vitals: BP 136/72   Pulse 81   Temp 98.2 F (36.8 C) (Oral)   Ht 5\' 7"  (1.702 m)   Wt 190 lb 2 oz (86.2 kg)   SpO2 97%   BMI 29.78 kg/m   Body mass index is 29.78 kg/m.  Advanced Directives 06/22/2017 12/09/2016  Does Patient Have a Medical Advance Directive? No No  Would patient like information on creating a medical advance directive? Yes (MAU/Ambulatory/Procedural Areas - Information given) No - Patient declined    Tobacco Social History   Tobacco Use  Smoking Status Passive Smoke Exposure - Never Smoker  Smokeless Tobacco Never Used     Counseling given: Not Answered   Clinical Intake:  Pre-visit preparation completed: Yes  Pain : No/denies pain     Nutritional Status: BMI 25 -29 Overweight Nutritional Risks: None Diabetes: Yes CBG done?: No Did pt. bring in CBG monitor from home?: No  How often do you need to have someone help you when you read instructions, pamphlets, or other written materials from your doctor or pharmacy?: 1 - Never What is the last grade level you completed in school?: 12th grade  Interpreter Needed?: No  Information entered by :: Andrez Grime, LPN  Past Medical History:  Diagnosis Date  . Diabetes mellitus without complication ([place])   . Hyperlipidemia   . Hypertension   . Osteoporosis    Past Surgical History:  Procedure Laterality Date  . LUMBAR DISC SURGERY  0867   no complications  . SPINE SURGERY     lumbar  . TUBAL LIGATION     Family History  Problem Relation Age of Onset  . Stroke Mother    Social History   Socioeconomic History  . Marital status: Married    Spouse name: Richard  . Number of children: 1  . Years of education: 74    . Highest education level: High school graduate  Occupational History  . Occupation: Youth worker  Social Needs  . Financial resource strain: Not hard at all  . Food insecurity:    Worry: Never true    Inability: Never true  . Transportation needs:    Medical: No    Non-medical: No  Tobacco Use  . Smoking status: Passive Smoke Exposure - Never Smoker  . Smokeless tobacco: Never Used  Substance and Sexual Activity  . Alcohol use: Not Currently  . Drug use: No  . Sexual activity: Yes    Birth control/protection: None  Lifestyle  . Physical activity:    Days per week: 3 days    Minutes per session: 60 min  . Stress: Not at all  Relationships  . Social connections:    Talks on phone: More than three times a week    Gets together: More than three times a week    Attends religious service: Never    Active member of club or organization: No    Attends meetings of clubs or organizations: Never    Relationship status: Married  Other Topics Concern  . Not on file  Social History Narrative   Lives with husband.    Outpatient Encounter Medications as of 06/22/2017  Medication Sig  . alendronate (FOSAMAX) 70 MG tablet Take 1 tablet (70 mg  total) by mouth once a week. Take on the same day each week  . aspirin 81 MG tablet Take 160 mg by mouth daily.  Marland Kitchen atorvastatin (LIPITOR) 40 MG tablet TAKE 1 TABLET EVERY DAY  . losartan (COZAAR) 50 MG tablet TAKE 1 TABLET EVERY DAY  . metFORMIN (GLUCOPHAGE-XR) 500 MG 24 hr tablet TAKE 1 TABLET EVERY DAY  . triamcinolone cream (KENALOG) 0.1 % Apply topically 2 (two) times daily as needed.  . triamcinolone cream (KENALOG) 0.1 % APPLY  CREAM EXTERNALLY TO AFFECTED AREA ONCE DAILY AS NEEDED  . [DISCONTINUED] azithromycin (ZITHROMAX) 250 MG tablet Take 2 tabs today then one tab each day after (Patient not taking: Reported on 12/23/2016)  . [DISCONTINUED] ondansetron (ZOFRAN-ODT) 4 MG disintegrating tablet Take 1 tablet (4 mg total) by  mouth every 8 (eight) hours as needed for nausea or vomiting. (Patient not taking: Reported on 12/23/2016)  . [DISCONTINUED] oxyCODONE-acetaminophen (PERCOCET/ROXICET) 5-325 MG tablet Take 1-2 tablets by mouth every 6 (six) hours as needed for severe pain. (Patient not taking: Reported on 12/23/2016)   No facility-administered encounter medications on file as of 06/22/2017.     Activities of Daily Living In your present state of health, do you have any difficulty performing the following activities: 06/22/2017 09/16/2016  Hearing? N N  Vision? N N  Difficulty concentrating or making decisions? N N  Walking or climbing stairs? N N  Dressing or bathing? N N  Doing errands, shopping? N N  Preparing Food and eating ? N -  Using the Toilet? N -  In the past six months, have you accidently leaked urine? Y -  Comment urine leakage when coughing only -  Do you have problems with loss of bowel control? N -  Managing your Medications? N -  Managing your Finances? N -  Housekeeping or managing your Housekeeping? N -  Some recent data might be hidden    Patient Care Team: Forrest Moron, MD as PCP - General (Internal Medicine)    Assessment:   This is a routine wellness examination for Kristen Pratt.  Exercise Activities and Dietary recommendations Current Exercise Habits: Home exercise routine, Type of exercise: walking, Time (Minutes): 60, Frequency (Times/Week): 3, Weekly Exercise (Minutes/Week): 180, Intensity: Mild, Exercise limited by: None identified  Goals    . Weight (lb) < 180 lb (81.6 kg)     Patient states that she wants to lose some weight and get to around 180 lbs.        Fall Risk Fall Risk  06/22/2017 12/23/2016 11/28/2016 09/16/2016 06/20/2016  Falls in the past year? No No No No No   Is the patient's home free of loose throw rugs in walkways, pet beds, electrical cords, etc?   yes      Grab bars in the bathroom? yes      Handrails on the stairs?   yes      Adequate  lighting?   yes  Timed Get Up and Go performed: yes, completed within 30 seconds   Depression Screen PHQ 2/9 Scores 06/22/2017 12/23/2016 11/28/2016 09/16/2016  PHQ - 2 Score 0 0 0 0     Cognitive Function     6CIT Screen 06/22/2017  What Year? 0 points  What month? 0 points  What time? 0 points  Count back from 20 0 points  Months in reverse 0 points  Repeat phrase 2 points  Total Score 2    Immunization History  Administered Date(s) Administered  . Hpv 01/07/2014  .  Influenza-Unspecified 01/29/2014, 12/13/2014, 11/27/2015, 12/14/2016  . Pneumococcal Conjugate-13 12/13/2014  . Pneumococcal Polysaccharide-23 01/07/2014  . Td 08/07/2004  . Tdap 05/15/2013    Qualifies for Shingles Vaccine?Discussed Shingrix vaccine  Screening Tests Health Maintenance  Topic Date Due  . Hepatitis C Screening  Oct 02, 1945  . FOOT EXAM  07/22/1957  . OPHTHALMOLOGY EXAM  07/22/1957  . HEMOGLOBIN A1C  06/23/2017  . INFLUENZA VACCINE  10/07/2017  . COLONOSCOPY  03/09/2018  . MAMMOGRAM  07/04/2018  . TETANUS/TDAP  05/16/2023  . DEXA SCAN  Completed  . PNA vac Low Risk Adult  Completed    Cancer Screenings: Lung: Low Dose CT Chest recommended if Age 73-80 years, 30 pack-year currently smoking OR have quit w/in 15years. Patient does not qualify. Breast:  Up to date on Mammogram? Yes, completed 07/03/16 Up to date of Bone Density/Dexa? Yes, completed 07/03/16 Colorectal: colonoscopy completed 03/09/2008  Additional Screenings: : Hepatitis C Screening: discuss with provider      Plan:   I have personally reviewed and noted the following in the patient's chart:   . Medical and social history . Use of alcohol, tobacco or illicit drugs  . Current medications and supplements . Functional ability and status . Nutritional status . Physical activity . Advanced directives . List of other physicians . Hospitalizations, surgeries, and ER visits in previous 12 months . Vitals . Screenings to  include cognitive, depression, and falls . Referrals and appointments  In addition, I have reviewed and discussed with patient certain preventive protocols, quality metrics, and best practice recommendations. A written personalized care plan for preventive services as well as general preventive health recommendations were provided to patient.   1. Hyperlipidemia, unspecified hyperlipidemia type - Lipid panel  2. Essential hypertension - Comprehensive metabolic panel - CBC with Differential/Platelet  3. Diabetes mellitus without complication (HCC) - Hemoglobin A1c - Microalbumin / creatinine urine ratio  4. Encounter for Medicare annual wellness exam  Andrez Grime, LPN  [id number]/[id number]

## 2017-06-23 LAB — CBC WITH DIFFERENTIAL/PLATELET
BASOS: 0 %
Basophils Absolute: 0 10*3/uL (ref 0.0–0.2)
EOS (ABSOLUTE): 0.3 10*3/uL (ref 0.0–0.4)
Eos: 4 %
Hematocrit: 43.6 % (ref 34.0–46.6)
Hemoglobin: 14.4 g/dL (ref 11.1–15.9)
IMMATURE GRANS (ABS): 0 10*3/uL (ref 0.0–0.1)
IMMATURE GRANULOCYTES: 0 %
LYMPHS: 16 %
Lymphocytes Absolute: 1.1 10*3/uL (ref 0.7–3.1)
MCH: 30.1 pg (ref 26.6–33.0)
MCHC: 33 g/dL (ref 31.5–35.7)
MCV: 91 fL (ref 79–97)
Monocytes Absolute: 0.9 10*3/uL (ref 0.1–0.9)
Monocytes: 13 %
NEUTROS PCT: 67 %
Neutrophils Absolute: 4.8 10*3/uL (ref 1.4–7.0)
PLATELETS: 233 10*3/uL (ref 150–379)
RBC: 4.79 x10E6/uL (ref 3.77–5.28)
RDW: 13.7 % (ref 12.3–15.4)
WBC: 7.2 10*3/uL (ref 3.4–10.8)

## 2017-06-23 LAB — LIPID PANEL
CHOL/HDL RATIO: 3.8 ratio (ref 0.0–4.4)
Cholesterol, Total: 140 mg/dL (ref 100–199)
HDL: 37 mg/dL — ABNORMAL LOW (ref >39)
LDL CALC: 86 mg/dL (ref 0–99)
TRIGLYCERIDES: 85 mg/dL (ref 0–149)
VLDL Cholesterol Cal: 17 mg/dL (ref 5–40)

## 2017-06-23 LAB — COMPREHENSIVE METABOLIC PANEL
A/G RATIO: 1.8 (ref 1.2–2.2)
ALT: 16 IU/L (ref 0–32)
AST: 20 IU/L (ref 0–40)
Albumin: 4.6 g/dL (ref 3.6–4.8)
Alkaline Phosphatase: 93 IU/L (ref 39–117)
BUN/Creatinine Ratio: 22 (ref 12–28)
BUN: 17 mg/dL (ref 8–27)
Bilirubin Total: 0.5 mg/dL (ref 0.0–1.2)
CALCIUM: 9.6 mg/dL (ref 8.7–10.3)
CO2: 23 mmol/L (ref 20–29)
Chloride: 102 mmol/L (ref 96–106)
Creatinine, Ser: 0.77 mg/dL (ref 0.57–1.00)
GFR calc Af Amer: 91 mL/min/{1.73_m2} (ref >59)
GFR, EST NON AFRICAN AMERICAN: 79 mL/min/{1.73_m2} (ref >59)
Globulin, Total: 2.6 g/dL (ref 1.5–4.5)
Glucose: 112 mg/dL — ABNORMAL HIGH (ref 65–99)
POTASSIUM: 4.4 mmol/L (ref 3.5–5.2)
Sodium: 142 mmol/L (ref 134–144)
Total Protein: 7.2 g/dL (ref 6.0–8.5)

## 2017-06-23 LAB — MICROALBUMIN / CREATININE URINE RATIO
CREATININE, UR: 192.1 mg/dL
MICROALBUM., U, RANDOM: 11.1 ug/mL
Microalb/Creat Ratio: 5.8 mg/g creat (ref 0.0–30.0)

## 2017-06-23 LAB — HEMOGLOBIN A1C
ESTIMATED AVERAGE GLUCOSE: 143 mg/dL
Hgb A1c MFr Bld: 6.6 % — ABNORMAL HIGH (ref 4.8–5.6)

## 2017-07-05 DIAGNOSIS — Z1231 Encounter for screening mammogram for malignant neoplasm of breast: Secondary | ICD-10-CM | POA: Diagnosis not present

## 2017-07-07 ENCOUNTER — Ambulatory Visit (INDEPENDENT_AMBULATORY_CARE_PROVIDER_SITE_OTHER): Payer: Medicare HMO | Admitting: Family Medicine

## 2017-07-07 ENCOUNTER — Other Ambulatory Visit: Payer: Self-pay

## 2017-07-07 ENCOUNTER — Encounter: Payer: Self-pay | Admitting: Family Medicine

## 2017-07-07 VITALS — BP 130/62 | HR 68 | Temp 98.2°F | Resp 17 | Ht 67.0 in | Wt 191.0 lb

## 2017-07-07 DIAGNOSIS — I10 Essential (primary) hypertension: Secondary | ICD-10-CM | POA: Diagnosis not present

## 2017-07-07 DIAGNOSIS — E119 Type 2 diabetes mellitus without complications: Secondary | ICD-10-CM

## 2017-07-07 DIAGNOSIS — E785 Hyperlipidemia, unspecified: Secondary | ICD-10-CM | POA: Diagnosis not present

## 2017-07-07 DIAGNOSIS — M818 Other osteoporosis without current pathological fracture: Secondary | ICD-10-CM | POA: Diagnosis not present

## 2017-07-07 MED ORDER — METFORMIN HCL ER 500 MG PO TB24: 500.0000 mg | ORAL_TABLET | Freq: Every day | ORAL | 0 refills | Status: DC

## 2017-07-07 MED ORDER — LOSARTAN POTASSIUM 50 MG PO TABS: 50.0000 mg | ORAL_TABLET | Freq: Every day | ORAL | 1 refills | Status: DC

## 2017-07-07 MED ORDER — ATORVASTATIN CALCIUM 40 MG PO TABS: 40.0000 mg | ORAL_TABLET | Freq: Every day | ORAL | 1 refills | Status: DC

## 2017-07-07 MED ORDER — ALENDRONATE SODIUM 70 MG PO TABS: 70.0000 mg | ORAL_TABLET | ORAL | 3 refills | Status: DC

## 2017-07-07 NOTE — Patient Instructions (Addendum)
IF you received an x-ray today, you will receive an invoice from [facility]. Please contact [facility] at [phone] with questions or concerns regarding your invoice.   IF you received labwork today, you will receive an invoice from [facility]. Please contact [facility] at [phone] with questions or concerns regarding your invoice.   Our billing staff will not be able to assist you with questions regarding bills from these companies.  You will be contacted with the lab results as soon as they are available. The fastest way to get your results is to activate your My Chart account. Instructions are located on the last page of this paperwork. If you have not heard from [place] regarding the results in 2 weeks, please contact this office.     Bone Health Bones protect organs, store calcium, and anchor muscles. Good health habits, such as eating nutritious foods and exercising regularly, are important for maintaining healthy bones. They can also help to prevent a condition that causes bones to lose density and become weak and brittle (osteoporosis). Why is bone mass important? Bone mass refers to the amount of bone tissue that you have. The higher your bone mass, the stronger your bones. An important step toward having healthy bones throughout life is to have strong and dense bones during childhood. A young adult who has a high bone mass is more likely to have a high bone mass later in life. Bone mass at its greatest it is called peak bone mass. A large decline in bone mass occurs in older adults. In women, it occurs about the time of menopause. During this time, it is important to practice good health habits, because if more bone is lost than what is replaced, the bones will become less healthy and more likely to break (fracture). If you find that you have a low bone mass, you may be able to prevent osteoporosis or further bone loss by changing your diet and lifestyle. How can I  find out if my bone mass is low? Bone mass can be measured with an X-ray test that is called a bone mineral density (BMD) test. This test is recommended for all women who are age 32 or older. It may also be recommended for men who are age 45 or older, or for people who are more likely to develop osteoporosis due to:  Having bones that break easily.  Having a long-term disease that weakens bones, such as kidney disease or rheumatoid arthritis.  Having menopause earlier than normal.  Taking medicine that weakens bones, such as steroids, thyroid hormones, or hormone treatment for breast cancer or prostate cancer.  Smoking.  Drinking three or more alcoholic drinks each day.  What are the nutritional recommendations for healthy bones? To have healthy bones, you need to get enough of the right minerals and vitamins. Most nutrition experts recommend getting these nutrients from the foods that you eat. Nutritional recommendations vary from person to person. Ask your health care provider what is healthy for you. Here are some general guidelines. Calcium Recommendations Calcium is the most important (essential) mineral for bone health. Most people can get enough calcium from their diet, but supplements may be recommended for people who are at risk for osteoporosis. Good sources of calcium include:  Dairy products, such as low-fat or nonfat milk, cheese, and yogurt.  Dark green leafy vegetables, such as bok choy and broccoli.  Calcium-fortified foods, such as orange juice, cereal, bread, soy beverages, and tofu products.  Nuts,  such as almonds.  Follow these recommended amounts for daily calcium intake:  Children, age 9?3: 700 mg.  Children, age 57?8: 1,000 mg.  Children, age 575?13: 1,300 mg.  Teens, age 63?18: 1,300 mg.  Adults, age 58?50: 1,000 mg.  Adults, age 57?70: ? Men: 1,000 mg. ? Women: 1,200 mg.  Adults, age 83 or older: 1,200 mg.  Pregnant and breastfeeding  females: ? Teens: 1,300 mg. ? Adults: 1,000 mg.  Vitamin D Recommendations Vitamin D is the most essential vitamin for bone health. It helps the body to absorb calcium. Sunlight stimulates the skin to make vitamin D, so be sure to get enough sunlight. If you live in a cold climate or you do not get outside often, your health care provider may recommend that you take vitamin D supplements. Good sources of vitamin D in your diet include:  Egg yolks.  Saltwater fish.  Milk and cereal fortified with vitamin D.  Follow these recommended amounts for daily vitamin D intake:  Children and teens, age 59?18: 53 international units.  Adults, age 30 or younger: 400-800 international units.  Adults, age 574 or older: 800-1,000 international units.  Other Nutrients Other nutrients for bone health include:  Phosphorus. This mineral is found in meat, poultry, dairy foods, nuts, and legumes. The recommended daily intake for adult men and adult women is 700 mg.  Magnesium. This mineral is found in seeds, nuts, dark green vegetables, and legumes. The recommended daily intake for adult men is 400?420 mg. For adult women, it is 310?320 mg.  Vitamin K. This vitamin is found in green leafy vegetables. The recommended daily intake is 120 mg for adult men and 90 mg for adult women.  What type of physical activity is best for building and maintaining healthy bones? Weight-bearing and strength-building activities are important for building and maintaining peak bone mass. Weight-bearing activities cause muscles and bones to work against gravity. Strength-building activities increases muscle strength that supports bones. Weight-bearing and muscle-building activities include:  Walking and hiking.  Jogging and running.  Dancing.  Gym exercises.  Lifting weights.  Tennis and racquetball.  Climbing stairs.  Aerobics.  Adults should get at least 30 minutes of moderate physical activity on most days.  Children should get at least 60 minutes of moderate physical activity on most days. Ask your health care provide what type of exercise is best for you. Where can I find more information? For more information, check out the following websites:  [place]: YardHomes.se  [employer] of Health: http://www.niams.AnonymousEar.fr.asp  This information is not intended to replace advice given to you by your health care provider. Make sure you discuss any questions you have with your health care provider. Document Released: 05/16/2003 Document Revised: 09/13/2015 Document Reviewed: 02/28/2014 Elsevier Interactive Patient Education  [employer].

## 2017-07-07 NOTE — Progress Notes (Signed)
Kristen Pratt is a 72 y.o. Female who presents for follow up from her Annual Wellness Visit.  Patient Active Problem List   Diagnosis Date Noted  . Osteoporosis 07/14/2016  . Type 2 diabetes mellitus without complication, without long-term current use of insulin ([place]) 06/20/2016  . Actinic keratoses 06/20/2016  . Eczema, dyshidrotic 02/20/2012  . Hyperlipidemia 04/12/2011  . Hypertension 04/12/2011   Osteoporosis   She takes fosamax She has NOT been to the dentist in a few years She denies any jaw pain She is exercising 4 times a week BY WALKING [place] She does not take a calcium supplement or vitamin D   Diabetes Lab Results  Component Value Date   HGBA1C 6.6 (H) 06/22/2017   Pt reports that she has been taking her metformin without side effects She denies nausea, vomiting, polyuria, polydipsia, numbness or tingling She sticks to a diabetic diet Wt Readings from Last 3 Encounters:  07/07/17 191 lb (86.6 kg)  06/22/17 190 lb 2 oz (86.2 kg)  12/23/16 186 lb (84.4 kg)    Dyslipidemia She is taking her lipitor and baby aspirin daily No family history of strokes She reports that she eats lots of fruits and veggies She denies claudication, chest pains, palpitations  The 10-year ASCVD risk score Mikey Bussing DC Jr., et al., 2013) is: 20.8%   Values used to calculate the score:     Age: 73 years     Sex: Female     Is Non-Hispanic African American: No     Diabetic: Yes     Tobacco smoker: No     Systolic Blood Pressure: 573 mmHg     Is BP treated: Yes     HDL Cholesterol: 37 mg/dL     Total Cholesterol: 140 mg/dL  Lab Results  Component Value Date   CHOL 140 06/22/2017   HDL 37 (L) 06/22/2017   LDLCALC 86 06/22/2017   TRIG 85 06/22/2017   CHOLHDL 3.8 06/22/2017   Hypertension: Patient here for follow-up of elevated blood pressure. She is exercising and is adherent to low salt diet.  Blood pressure is well controlled at home. Cardiac symptoms none.  Patient denies chest pain, chest pressure/discomfort, claudication, dyspnea, exertional chest pressure/discomfort, fatigue, irregular heart beat, lower extremity edema, near-syncope, orthopnea and palpitations.  Cardiovascular risk factors: diabetes mellitus, dyslipidemia, hypertension and obesity (BMI >= 30 kg/m2). Use of agents associated with hypertension: none. History of target organ damage: none.     Past Medical History:  Diagnosis Date  . Diabetes mellitus without complication ([place])   . Hyperlipidemia   . Hypertension   . Osteoporosis      Past Surgical History:  Procedure Laterality Date  . LUMBAR DISC SURGERY  2202   no complications  . SPINE SURGERY     lumbar  . TUBAL LIGATION       Outpatient Medications Prior to Visit  Medication Sig Dispense Refill  . alendronate (FOSAMAX) 70 MG tablet Take 1 tablet (70 mg total) by mouth once a week. Take on the same day each week 12 tablet 3  . aspirin 81 MG tablet Take 160 mg by mouth daily.    Marland Kitchen atorvastatin (LIPITOR) 40 MG tablet TAKE 1 TABLET EVERY DAY 90 tablet 0  . losartan (COZAAR) 50 MG tablet TAKE 1 TABLET EVERY DAY 90 tablet 0  . metFORMIN (GLUCOPHAGE-XR) 500 MG 24 hr tablet TAKE 1 TABLET EVERY DAY 90 tablet 0  . triamcinolone cream (KENALOG) 0.1 %  Apply topically 2 (two) times daily as needed. 454 g 3  . triamcinolone cream (KENALOG) 0.1 % APPLY  CREAM EXTERNALLY TO AFFECTED AREA ONCE DAILY AS NEEDED     No facility-administered medications prior to visit.     No Known Allergies   Family History  Problem Relation Age of Onset  . Stroke Mother      Social History   Socioeconomic History  . Marital status: Married    Spouse name: Richard  . Number of children: 1  . Years of education: 31  . Highest education level: High school graduate  Occupational History  . Occupation: Youth worker  Social Needs  . Financial resource strain: Not hard at all  . Food insecurity:    Worry: Never true     Inability: Never true  . Transportation needs:    Medical: No    Non-medical: No  Tobacco Use  . Smoking status: Passive Smoke Exposure - Never Smoker  . Smokeless tobacco: Never Used  Substance and Sexual Activity  . Alcohol use: Not Currently  . Drug use: No  . Sexual activity: Yes    Birth control/protection: None  Lifestyle  . Physical activity:    Days per week: 3 days    Minutes per session: 60 min  . Stress: Not at all  Relationships  . Social connections:    Talks on phone: More than three times a week    Gets together: More than three times a week    Attends religious service: Never    Active member of club or organization: No    Attends meetings of clubs or organizations: Never    Relationship status: Married  Other Topics Concern  . Not on file  Social History Narrative   Lives with husband.      Recent Hospitalizations? no  Health Habits: Current exercise activities include: walking Exercise: 4 times/week. Diet: in general, a "healthy" diet    Alcohol intake: none  Health Risk Assessment: The patient has completed a Health Risk Assessment. This has been reveiwed with them and has been scanned into the [facility] system as an attached document.  Current Medical Providers and Suppliers: [facility] Patient Care Team: Forrest Moron, MD as PCP - General (Internal Medicine) No future appointments.   Age-appropriate Screening Schedule: The list below includes current immunization status and future screening recommendations based on patient's age. Orders for these recommended tests are listed in the plan section. The patient has been provided with a written plan. Immunization History  Administered Date(s) Administered  . Hpv 01/07/2014  . Influenza-Unspecified 01/29/2014, 12/13/2014, 11/27/2015, 12/14/2016  . Pneumococcal Conjugate-13 12/13/2014  . Pneumococcal Polysaccharide-23 01/07/2014  . Td 08/07/2004  . Tdap 05/15/2013      Depression Screen-PHQ2/9  completed today  Depression screen [facility] 2/9 07/07/2017 06/22/2017 12/23/2016 11/28/2016 09/16/2016  Decreased Interest 0 0 0 0 0  Down, Depressed, Hopeless 0 0 0 0 0  PHQ - 2 Score 0 0 0 0 0    Depression Severity and Treatment Recommendations:  0-4= None  5-9= Mild / Treatment: Support, educate to call if worse; return in one month  10-14= Moderate / Treatment: Support, watchful waiting; Antidepressant or Psycotherapy  15-19= Moderately severe / Treatment: Antidepressant OR Psychotherapy  >= 20 = Major depression, severe / Antidepressant AND Psychotherapy    Identification of Risk Factors: Risk factors include: none  Review of Systems  Constitutional: Negative for chills and fever.  HENT: Negative for ear pain, hearing loss  and tinnitus.   Eyes: Negative for blurred vision and double vision.  Respiratory: Negative for cough, shortness of breath and wheezing.   Cardiovascular: Negative for chest pain, palpitations and leg swelling.  Gastrointestinal: Negative for abdominal pain, diarrhea, nausea and vomiting.  Genitourinary: Negative for dysuria, frequency, hematuria and urgency.  Skin: Negative for itching and rash.  Neurological: Negative for dizziness, tingling and headaches.  Psychiatric/Behavioral: Negative for depression and hallucinations. The patient is not nervous/anxious and does not have insomnia.     Objective:   Vitals:   07/07/17 0829  BP: 130/62  Pulse: 68  Resp: 17  Temp: 98.2 F (36.8 C)  TempSrc: Oral  SpO2: 97%  Weight: 191 lb (86.6 kg)  Height: 5\' 7"  (1.702 m)    Body mass index is 29.91 kg/m.  BP 130/62 (BP Location: Right Arm, Patient Position: Sitting, Cuff Size: Normal)   Pulse 68   Temp 98.2 F (36.8 C) (Oral)   Resp 17   Ht 5\' 7"  (1.702 m)   Wt 191 lb (86.6 kg)   SpO2 97%   BMI 29.91 kg/m   General Appearance:    Alert, cooperative, no distress, appears stated age  Head:    Normocephalic, without obvious abnormality, atraumatic  Eyes:     PERRL, conjunctiva/corneas clear, EOM's intact, fundi    benign, both eyes  Ears:    Normal TM's and external ear canals, both ears  Nose:   Nares normal, septum midline, mucosa normal, no drainage    or sinus tenderness  Throat:   Lips, mucosa, and tongue normal; teeth and gums normal  Neck:   Supple, symmetrical, trachea midline, no adenopathy;    thyroid:  no enlargement/tenderness/nodules; no carotid   bruit or JVD  Back:     Symmetric, no curvature, ROM normal, no CVA tenderness  Lungs:     Clear to auscultation bilaterally, respirations unlabored  Chest Wall:    No tenderness or deformity   Heart:    Regular rate and rhythm, S1 and S2 normal, no murmur, rub   or gallop  Breast Exam:    Demonstrated breast exam. No tenderness, masses, or nipple abnormality  Abdomen:     Soft, non-tender, bowel sounds active all four quadrants,    no masses, no organomegaly  Genitalia:    Deferred  Extremities:   Extremities normal, atraumatic, no cyanosis or edema  Pulses:   2+ and symmetric all extremities  Skin:   Skin color, texture, turgor normal, no rashes or lesions  Lymph nodes:   Cervical, supraclavicular, and axillary nodes normal  Neurologic:   CNII-XII intact, normal strength, sensation and reflexes    throughout      Assessment/Plan:       Avian was seen today for medication refills and diabetes.   Diagnoses and all orders for this visit:  Essential hypertension-  Advised continuing exercise and DASH diet -     losartan (COZAAR) 50 MG tablet; Take 1 tablet (50 mg total) by mouth daily.  Hyperlipidemia, unspecified hyperlipidemia type- at goal, continue asa 81mg  -     atorvastatin (LIPITOR) 40 MG tablet; Take 1 tablet (40 mg total) by mouth daily.  Other osteoporosis without current pathological fracture- advised pt follow up for dental exam -     alendronate (FOSAMAX) 70 MG tablet; Take 1 tablet (70 mg total) by mouth once a week. Take on the same day each  week  Type 2 diabetes mellitus without complication, without long-term current use  of insulin ([place])- a1c at goal Continue ADA diet and regular exercise Metformin refilled -     HM Diabetes Foot Exam -     metFORMIN (GLUCOPHAGE-XR) 500 MG 24 hr tablet; Take 1 tablet (500 mg total) by mouth daily.     No follow-ups on file.  No future appointments.  Patient Instructions       IF you received an x-ray today, you will receive an invoice from [facility]. Please contact [facility]iology at [phone] with questions or concerns regarding your invoice.   IF you received labwork today, you will receive an invoice from [facility]. Please contact [facility] at [phone] with questions or concerns regarding your invoice.   Our billing staff will not be able to assist you with questions regarding bills from these companies.  You will be contacted with the lab results as soon as they are available. The fastest way to get your results is to activate your My Chart account. Instructions are located on the last page of this paperwork. If you have not heard from [place] regarding the results in 2 weeks, please contact this office.       An after visit summary with all of these plans was given to the patient.

## 2017-08-03 ENCOUNTER — Other Ambulatory Visit: Payer: Self-pay | Admitting: Family Medicine

## 2017-08-03 DIAGNOSIS — E119 Type 2 diabetes mellitus without complications: Secondary | ICD-10-CM

## 2017-08-03 DIAGNOSIS — E785 Hyperlipidemia, unspecified: Secondary | ICD-10-CM

## 2017-08-03 DIAGNOSIS — I10 Essential (primary) hypertension: Secondary | ICD-10-CM

## 2017-08-04 NOTE — Telephone Encounter (Signed)
Patient called, left VM to call back to clarify which pharmacy she is using, [facility] or [facility]. [facility] sent a request for atorvastatin, losartan and metformin, which were already sent to [facility] on 07/07/17. Request denied at [facility].

## 2017-08-05 ENCOUNTER — Telehealth: Payer: Self-pay | Admitting: Family Medicine

## 2017-08-05 DIAGNOSIS — M818 Other osteoporosis without current pathological fracture: Secondary | ICD-10-CM

## 2017-08-05 DIAGNOSIS — E785 Hyperlipidemia, unspecified: Secondary | ICD-10-CM

## 2017-08-05 DIAGNOSIS — I10 Essential (primary) hypertension: Secondary | ICD-10-CM

## 2017-08-05 DIAGNOSIS — E119 Type 2 diabetes mellitus without complications: Secondary | ICD-10-CM

## 2017-08-05 NOTE — Telephone Encounter (Signed)
Copied from [place] [id number]. Topic: Quick Communication - Rx Refill/Question >> Aug 05, 2017 12:09 PM Carolyn Stare wrote: The below meds was snet to the wrong pharmacy [facility]. Pt no longer use [facility]   Medication    atorvastatin (LIPITOR) 40 MG tablet   losartan (COZAAR) 50 MG tablet   metFORMIN (GLUCOPHAGE-XR) 500 MG 24 hr tablet   Has the patient contacted their pharmacy yes   ( Preferred Pharmacy  [facility]  Agent: Please be advised that RX refills may take up to 3 business days. We ask that you follow-up with your pharmacy.

## 2017-08-06 MED ORDER — ATORVASTATIN CALCIUM 40 MG PO TABS: 40.0000 mg | ORAL_TABLET | Freq: Every day | ORAL | 1 refills | Status: DC

## 2017-08-06 NOTE — Telephone Encounter (Signed)
LOV  07/07/17 Dr. Nolon Rod

## 2017-08-06 NOTE — Telephone Encounter (Signed)
Medication sent to [facility] pharmacy.  

## 2017-08-13 MED ORDER — ALENDRONATE SODIUM 70 MG PO TABS: 70.0000 mg | ORAL_TABLET | ORAL | 3 refills | Status: AC

## 2017-08-13 MED ORDER — LOSARTAN POTASSIUM 50 MG PO TABS: 50.0000 mg | ORAL_TABLET | Freq: Every day | ORAL | 1 refills | Status: DC

## 2017-08-13 MED ORDER — METFORMIN HCL ER 500 MG PO TB24: 500.0000 mg | ORAL_TABLET | Freq: Every day | ORAL | 1 refills | Status: DC

## 2017-08-13 NOTE — Telephone Encounter (Signed)
Medication has been sent in 

## 2017-08-13 NOTE — Telephone Encounter (Signed)
Patient stopped by needs Alendronate 70 mg,Losartan 50 Mg and metformin 500 mg. Refills sent to [facility] for refills. Pateint has a couple of days and needs them asap. Please call [phone]  FR

## 2017-09-20 ENCOUNTER — Telehealth: Payer: Self-pay | Admitting: Family Medicine

## 2017-09-20 NOTE — Telephone Encounter (Signed)
[facility] called and spoke to [place], Education administrator. I asked did the receive the prescriptions for Losartan and Metformin sent on 08/13/17, she says they were received and are on file according to the note on the prescription. She says she will go ahead and fill these medications and send out to the patient and it usually takes 5-7 business days. She says if the patient needs it sooner, to send a 15 day supply to a local pharmacy. Patient called, left VM to return call to the office to advise about the refill request.

## 2017-09-20 NOTE — Telephone Encounter (Signed)
Copied from [place] [phone]. Topic: Quick Communication - See Telephone Encounter >> Sep 20, 2017 11:20 AM Conception Chancy, NT wrote: CRM for notification. See Telephone encounter for: 09/20/17.  Patient is calling and states she is needing a refill on metFORMIN (GLUCOPHAGE-XR) 500 MG 24 hr tablet and losartan (COZAAR) 50 MG tablet. She states she requested these medications on 08/05/17 and never received any medication in the mail. Please advise.  [place], [place] [address] Phone: [phone] Fax: [phone]

## 2017-09-20 NOTE — Telephone Encounter (Signed)
Patient returned call, advised the Metformin and Losartan are being filled and should be received in 5-7 business day, asked will she need some sent to a local pharmacy. Patient says she should have enough until it arrives.

## 2017-10-22 ENCOUNTER — Emergency Department (HOSPITAL_COMMUNITY): Payer: Medicare HMO

## 2017-10-22 ENCOUNTER — Emergency Department (HOSPITAL_COMMUNITY)
Admission: EM | Admit: 2017-10-22 | Discharge: 2017-10-22 | Disposition: A | Discharge status: Home / Self Care | Payer: Medicare HMO | Attending: Emergency Medicine | Admitting: Emergency Medicine

## 2017-10-22 ENCOUNTER — Encounter: Payer: Self-pay | Admitting: Physician Assistant

## 2017-10-22 ENCOUNTER — Ambulatory Visit (INDEPENDENT_AMBULATORY_CARE_PROVIDER_SITE_OTHER): Payer: Medicare HMO | Admitting: Physician Assistant

## 2017-10-22 ENCOUNTER — Ambulatory Visit: Payer: Self-pay | Admitting: Family Medicine

## 2017-10-22 ENCOUNTER — Encounter (HOSPITAL_COMMUNITY): Payer: Self-pay | Admitting: Emergency Medicine

## 2017-10-22 ENCOUNTER — Other Ambulatory Visit: Payer: Self-pay

## 2017-10-22 VITALS — BP 153/71 | HR 76 | Temp 98.0°F | Resp 18 | Ht 67.21 in | Wt 191.8 lb

## 2017-10-22 DIAGNOSIS — E119 Type 2 diabetes mellitus without complications: Secondary | ICD-10-CM | POA: Insufficient documentation

## 2017-10-22 DIAGNOSIS — Z7982 Long term (current) use of aspirin: Secondary | ICD-10-CM | POA: Diagnosis not present

## 2017-10-22 DIAGNOSIS — Z7984 Long term (current) use of oral hypoglycemic drugs: Secondary | ICD-10-CM | POA: Insufficient documentation

## 2017-10-22 DIAGNOSIS — R29898 Other symptoms and signs involving the musculoskeletal system: Secondary | ICD-10-CM

## 2017-10-22 DIAGNOSIS — I10 Essential (primary) hypertension: Secondary | ICD-10-CM

## 2017-10-22 DIAGNOSIS — Z7722 Contact with and (suspected) exposure to environmental tobacco smoke (acute) (chronic): Secondary | ICD-10-CM | POA: Insufficient documentation

## 2017-10-22 DIAGNOSIS — Z79899 Other long term (current) drug therapy: Secondary | ICD-10-CM | POA: Diagnosis not present

## 2017-10-22 DIAGNOSIS — M79604 Pain in right leg: Secondary | ICD-10-CM | POA: Insufficient documentation

## 2017-10-22 DIAGNOSIS — E785 Hyperlipidemia, unspecified: Secondary | ICD-10-CM | POA: Diagnosis not present

## 2017-10-22 DIAGNOSIS — R51 Headache: Secondary | ICD-10-CM | POA: Diagnosis not present

## 2017-10-22 LAB — BASIC METABOLIC PANEL
Anion gap: 9 (ref 5–15)
BUN: 19 mg/dL (ref 8–23)
CHLORIDE: 107 mmol/L (ref 98–111)
CO2: 26 mmol/L (ref 22–32)
Calcium: 9.7 mg/dL (ref 8.9–10.3)
Creatinine, Ser: 0.72 mg/dL (ref 0.44–1.00)
GFR calc Af Amer: >60 mL/min (ref >60)
GFR calc non Af Amer: >60 mL/min (ref >60)
Glucose, Bld: 86 mg/dL (ref 70–99)
Potassium: 4.5 mmol/L (ref 3.5–5.1)
Sodium: 142 mmol/L (ref 135–145)

## 2017-10-22 LAB — GLUCOSE, POCT (MANUAL RESULT ENTRY): POC Glucose: 160 mg/dl — AB (ref 70–99)

## 2017-10-22 LAB — CBC WITH DIFFERENTIAL/PLATELET
Basophils Absolute: 0 10*3/uL (ref 0.0–0.1)
Basophils Relative: 0 %
Eosinophils Absolute: 0.3 10*3/uL (ref 0.0–0.7)
Eosinophils Relative: 4 %
HEMATOCRIT: 44.1 % (ref 36.0–46.0)
HEMOGLOBIN: 14.6 g/dL (ref 12.0–15.0)
LYMPHS ABS: 1.2 10*3/uL (ref 0.7–4.0)
LYMPHS PCT: 16 %
MCH: 30.7 pg (ref 26.0–34.0)
MCHC: 33.1 g/dL (ref 30.0–36.0)
MCV: 92.6 fL (ref 78.0–100.0)
MONOS PCT: 12 %
Monocytes Absolute: 0.9 10*3/uL (ref 0.1–1.0)
NEUTROS ABS: 5.3 10*3/uL (ref 1.7–7.7)
Neutrophils Relative %: 68 %
Platelets: 237 10*3/uL (ref 150–400)
RBC: 4.76 MIL/uL (ref 3.87–5.11)
RDW: 13.1 % (ref 11.5–15.5)
WBC: 7.7 10*3/uL (ref 4.0–10.5)

## 2017-10-22 LAB — MAGNESIUM: Magnesium: 1.9 mg/dL (ref 1.7–2.4)

## 2017-10-22 NOTE — ED Provider Notes (Signed)
[facility] DEPT Provider Note   CSN: [id number] Arrival date & time: 10/22/17  1451     History   Chief Complaint Chief Complaint  Patient presents with  . Extremity Weakness  . Leg Pain    HPI Kristen Pratt is a 72 y.o. female.  The history is provided by the patient.  Leg Pain   This is a chronic problem. The current episode started more than 1 week ago. The problem occurs every several days. The problem has been resolved. The pain is present in the right upper leg. The quality of the pain is described as aching. The pain is at a severity of 0/10. The patient is experiencing no pain. Associated symptoms include stiffness. Pertinent negatives include no numbness, full range of motion, no tingling and no itching. She has tried OTC pain medications for the symptoms. The treatment provided moderate relief. There has been no history of extremity trauma. Family history is significant for no rheumatoid arthritis and no gout.    Past Medical History:  Diagnosis Date  . Diabetes mellitus without complication ([place])   . Hyperlipidemia   . Hypertension   . Osteoporosis     Patient Active Problem List   Diagnosis Date Noted  . Osteoporosis 07/14/2016  . Type 2 diabetes mellitus without complication, without long-term current use of insulin ([place]) 06/20/2016  . Actinic keratoses 06/20/2016  . Eczema, dyshidrotic 02/20/2012  . Hyperlipidemia 04/12/2011  . Hypertension 04/12/2011    Past Surgical History:  Procedure Laterality Date  . LUMBAR DISC SURGERY  7867   no complications  . SPINE SURGERY     lumbar  . TUBAL LIGATION       OB History   None      Home Medications    Prior to Admission medications   Medication Sig Start Date End Date Taking? Authorizing Provider  alendronate (FOSAMAX) 70 MG tablet Take 1 tablet (70 mg total) by mouth once a week. Take on the same day each week 08/13/17 08/13/18 Yes Delia Chimes A, MD  aspirin 81 MG  tablet Take 81 mg by mouth daily.    Yes [provider]  atorvastatin (LIPITOR) 40 MG tablet Take 1 tablet (40 mg total) by mouth daily. 08/06/17  Yes Stallings, Zoe A, MD  losartan (COZAAR) 50 MG tablet Take 1 tablet (50 mg total) by mouth daily. 08/13/17  Yes Stallings, Zoe A, MD  metFORMIN (GLUCOPHAGE-XR) 500 MG 24 hr tablet Take 1 tablet (500 mg total) by mouth daily. 08/13/17  Yes Stallings, Zoe A, MD  naproxen sodium (ALEVE) 220 MG tablet Take 220 mg by mouth daily as needed (pain).   Yes [provider]  triamcinolone cream (KENALOG) 0.1 % Apply topically 2 (two) times daily as needed. Patient taking differently: Apply 1 application topically 2 (two) times daily as needed (dry skin).  05/15/13  Yes Copland, Gay Filler, MD    Family History Family History  Problem Relation Age of Onset  . Stroke Mother     Social History Social History   Tobacco Use  . Smoking status: Passive Smoke Exposure - Never Smoker  . Smokeless tobacco: Never Used  Substance Use Topics  . Alcohol use: Not Currently  . Drug use: No     Allergies   Patient has no known allergies.   Review of Systems Review of Systems  Constitutional: Negative for chills and fever.  HENT: Negative for ear pain and sore throat.   Eyes: Negative  for pain and visual disturbance.  Respiratory: Negative for cough and shortness of breath.   Cardiovascular: Negative for chest pain and palpitations.  Gastrointestinal: Negative for abdominal pain and vomiting.  Genitourinary: Negative for dysuria and hematuria.  Musculoskeletal: Positive for stiffness. Negative for arthralgias, back pain, gait problem, joint swelling, neck pain and neck stiffness.  Skin: Negative for color change, itching and rash.  Neurological: Negative for tingling, seizures, syncope and numbness.  All other systems reviewed and are negative.    Physical Exam Updated Vital Signs  ED Triage Vitals  Enc Vitals Group     BP 10/22/17  1511 (!) 184/61     Pulse Rate 10/22/17 1511 71     Resp --      Temp 10/22/17 1511 98 F (36.7 C)     Temp Source 10/22/17 1511 Oral     SpO2 10/22/17 1511 99 %     Weight --      Height --      Head Circumference --      Peak Flow --      Pain Score 10/22/17 1512 0     Pain Loc --      Pain Edu? --      Excl. in [place]? --     Physical Exam  Constitutional: She is oriented to person, place, and time. She appears well-developed and well-nourished. No distress.  HENT:  Head: Normocephalic and atraumatic.  Nose: Nose normal.  Mouth/Throat: No oropharyngeal exudate.  Eyes: Pupils are equal, round, and reactive to light. Conjunctivae and EOM are normal.  Neck: Normal range of motion. Neck supple.  Cardiovascular: Normal rate, regular rhythm and intact distal pulses.  Murmur heard. Pulmonary/Chest: Effort normal and breath sounds normal. No respiratory distress.  Abdominal: Soft. There is no tenderness.  Musculoskeletal: Normal range of motion. She exhibits no edema or tenderness.  Neurological: She is alert and oriented to person, place, and time. No cranial nerve deficit or sensory deficit. She exhibits normal muscle tone. Coordination normal.  5 out of 5 strength throughout, normal gait, no drift, normal finger-to-nose finger, no visual field deficits  Skin: Skin is warm and dry. Capillary refill takes less than 2 seconds.  Psychiatric: She has a normal mood and affect.  Nursing note and vitals reviewed.    ED Treatments / Results  Labs (all labs ordered are listed, but only abnormal results are displayed) Labs Reviewed  CBC WITH DIFFERENTIAL/PLATELET  BASIC METABOLIC PANEL  MAGNESIUM    EKG None  Radiology Ct Head Wo Contrast  Result Date: 10/22/2017 CLINICAL DATA:  Intermittent left leg weakness and pain for the past 6 months. EXAM: CT HEAD WITHOUT CONTRAST TECHNIQUE: Contiguous axial images were obtained from the base of the skull through the vertex without  intravenous contrast. COMPARISON:  08/09/2008. FINDINGS: Brain: Stable minimal patchy white matter low density in both cerebral hemispheres. Normal size and position of the ventricles. No intracranial hemorrhage, mass lesion or CT evidence of acute infarction. Vascular: No hyperdense vessel or unexpected calcification. Skull: Mild bilateral hyperostosis frontalis. Sinuses/Orbits: Unremarkable. Other: None. IMPRESSION: 1. No acute abnormality. 2. 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 Electronically Signed   By: Claudie Revering M.D.   On: 10/22/2017 16:08    Procedures Procedures (including critical care time)  Medications Ordered in ED Medications - No data to display   Initial Impression / Assessment and Plan / ED Course  I have reviewed the triage vital  signs and the nursing notes.  Pertinent labs & imaging results that were available during my care of the patient were reviewed by me and considered in my medical decision making (see chart for details).     Kristen Pratt is a 72 year old female with history of osteoporosis, high cholesterol, hypertension, diabetes who presents to the ED with right leg pain. Patient with overall unremarkable vitals.  No fever.  Patient has had intermittent right thigh pain for the last several months.  Denies any weakness.  Patient mostly has difficulty with ambulation when she has her right thigh pain.  Denies any trauma.  Patient was seen at PCP today and sent for stroke work-up.  However, patient with normal neurological exam.  No longer has any right leg pain.  Has normal gait.  Denies any sensation changes.  History and physical is consistent with a muscle spasm.  Patient is overall well-appearing.  Basic labs were checked and showed no significant electrolyte abnormalities, kidney injury, leukocytosis.  Head CT was performed that showed no acute findings.  Do not believe there is any need for further  stroke work-up at this time.  Told patient to increase hydration.  Told to continue to use NSAIDs as needed and discharged from the ED in good condition.  Recommend follow-up with primary care provider for further work-up.  Final Clinical Impressions(s) / ED Diagnoses   Final diagnoses:  Right leg pain    ED Discharge Orders    None       Lennice Sites, DO 10/22/17 1648

## 2017-10-22 NOTE — ED Triage Notes (Signed)
Pt reports that for over 6 months had intermittent left leg weakness and pain. Saw PA at [facility] PCP today

## 2017-10-22 NOTE — Telephone Encounter (Signed)
Pt called c/o right leg weakness. Pt stated symptoms began yesterday. Pt stated the weakness comes and goes but is present with walking. Pt denies chest pain, difficulty breathing or palpitations. Pt denies headache, dizziness, vision loss, double vision, changes in speech. Pt stated due to leg weakness she is unsteady on her feet when she walks. Pt other complaint is cold symptoms.  Care advice given per protocol. Pt given an appointment for today at 1:20 pm with Tenna Delaine PA. PCP not working today.  Reason for Disposition . [1] Weakness of the face, arm / hand, or leg / foot on one side of the body AND [2] gradual onset (e.g., days to weeks) AND [3] present now  Answer Assessment - Initial Assessment Questions 1. SYMPTOM: "What is the main symptom you are concerned about?" (e.g., weakness, numbness)     Right leg weakness 2. ONSET: "When did this start?" (minutes, hours, days; while sleeping)     yesterday 3. LAST NORMAL: "When was the last time you were normal (no symptoms)?"     Day before yesterday  4. PATTERN "Does this come and go, or has it been constant since it started?"  "Is it present now?"     Comes and goes is present with walking 5. CARDIAC SYMPTOMS: "Have you had any of the following symptoms: chest pain, difficulty breathing, palpitations?"     no 6. NEUROLOGIC SYMPTOMS: "Have you had any of the following symptoms: headache, dizziness, vision loss, double vision, changes in speech, unsteady on your feet?"     Unsteady when first gets up to walk 7. OTHER SYMPTOMS: "Do you have any other symptoms?"     Cold sx 8. PREGNANCY: "Is there any chance you are pregnant?" "When was your last menstrual period?"     n/a  Protocols used: NEUROLOGIC DEFICIT-A-AH

## 2017-10-22 NOTE — Patient Instructions (Addendum)
Please go to [facility] immediately for further evaluation and management. Thank you for letting me participate in your health and well being.   IF you received an x-ray today, you will receive an invoice from [facility]. Please contact [facility] at [phone] with questions or concerns regarding your invoice.   IF you received labwork today, you will receive an invoice from [facility]. Please contact [facility] at [phone] with questions or concerns regarding your invoice.   Our billing staff will not be able to assist you with questions regarding bills from these companies.  You will be contacted with the lab results as soon as they are available. The fastest way to get your results is to activate your My Chart account. Instructions are located on the last page of this paperwork. If you have not heard from [place] regarding the results in 2 weeks, please contact this office.

## 2017-10-22 NOTE — Progress Notes (Signed)
Kristen Pratt  MRN: [id number] DOB: 26-Apr-1945  Subjective:  Kristen Pratt is a 72 y.o. female with PMH T2DM, HTN, HLD, and osteoporosis seen in office today for a chief complaint of sudden onset right leg weakness starting yesterday around 3 PM.  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  Felt as though she could not put full weight on her leg.  She continued to work her shift.  Went home and rested.  Today, got up and still felt like she could put weight on her leg but does not feel stable.  She is continued to ambulate without falling.  Denies numbness, tingling, redness, warmth, pain, chest pain, shortness of breath, heart palpitations, headache, visual disturbance, nausea, vomiting, slurred speech, dysphagia, upper extremity weakness, and lower leg swelling.  Just ate prior to arrival.  Denies acute back or neck injury. No increase in exercise. No recent vaccinations. No known tick exposure. PMH of back surgery and left sided sciatica. No past medical history of MI, stroke, DVT, or thyroid disease.   Review of Systems  Constitutional: Negative for chills, diaphoresis, fatigue and fever.  Eyes: Negative for photophobia and visual disturbance.  Endocrine: Negative for cold intolerance, heat intolerance, polydipsia, polyphagia and polyuria.  Genitourinary: Negative for dysuria and urgency.  Musculoskeletal: Positive for gait problem.  Skin: Negative for rash.  Neurological: Negative for dizziness, seizures, facial asymmetry and light-headedness.  Psychiatric/Behavioral: Negative for confusion.    Patient Active Problem List   Diagnosis Date Noted  . Osteoporosis 07/14/2016  . Type 2 diabetes mellitus without complication, without long-term current use of insulin ([place]) 06/20/2016  . Actinic keratoses 06/20/2016  . Eczema, dyshidrotic 02/20/2012  . Hyperlipidemia 04/12/2011  . Hypertension 04/12/2011    Current Outpatient Medications on File Prior to Visit    Medication Sig Dispense Refill  . alendronate (FOSAMAX) 70 MG tablet Take 1 tablet (70 mg total) by mouth once a week. Take on the same day each week 12 tablet 3  . aspirin 81 MG tablet Take 81 mg by mouth daily.     Marland Kitchen atorvastatin (LIPITOR) 40 MG tablet Take 1 tablet (40 mg total) by mouth daily. 90 tablet 1  . losartan (COZAAR) 50 MG tablet Take 1 tablet (50 mg total) by mouth daily. 90 tablet 1  . metFORMIN (GLUCOPHAGE-XR) 500 MG 24 hr tablet Take 1 tablet (500 mg total) by mouth daily. 90 tablet 1  . triamcinolone cream (KENALOG) 0.1 % Apply topically 2 (two) times daily as needed. 454 g 3  . triamcinolone cream (KENALOG) 0.1 % APPLY  CREAM EXTERNALLY TO AFFECTED AREA ONCE DAILY AS NEEDED     No current facility-administered medications on file prior to visit.     No Known Allergies    Social History   Socioeconomic History  . Marital status: Married    Spouse name: Richard  . Number of children: 1  . Years of education: 86  . Highest education level: High school graduate  Occupational History  . Occupation: Youth worker  Social Needs  . Financial resource strain: Not hard at all  . Food insecurity:    Worry: Never true    Inability: Never true  . Transportation needs:    Medical: No    Non-medical: No  Tobacco Use  . Smoking status: Passive Smoke Exposure - Never Smoker  . Smokeless tobacco: Never Used  Substance and Sexual Activity  . Alcohol use: Not Currently  . Drug use: No  .  Sexual activity: Yes    Birth control/protection: None  Lifestyle  . Physical activity:    Days per week: 3 days    Minutes per session: 60 min  . Stress: Not at all  Relationships  . Social connections:    Talks on phone: More than three times a week    Gets together: More than three times a week    Attends religious service: Never    Active member of club or organization: No    Attends meetings of clubs or organizations: Never    Relationship status: Married  .  Intimate partner violence:    Fear of current or ex partner: No    Emotionally abused: No    Physically abused: No    Forced sexual activity: No  Other Topics Concern  . Not on file  Social History Narrative   Lives with husband.    Objective:  BP (!) 153/71   Pulse 76   Temp 98 F (36.7 C) (Oral)   Resp 18   Ht 5' 7.21" (1.707 m)   Wt 191 lb 12.8 oz (87 kg)   SpO2 97%   BMI 29.86 kg/m   Physical Exam  Constitutional: She is oriented to person, place, and time. She appears well-developed and well-nourished. No distress.  HENT:  Head: Normocephalic and atraumatic.  Eyes: Pupils are equal, round, and reactive to light. Conjunctivae and EOM are normal.  Neck: Normal range of motion and full passive range of motion without pain. Neck supple.  Cardiovascular: Normal rate, regular rhythm, normal heart sounds and intact distal pulses.  Pulmonary/Chest: Effort normal.  Neurological: She is alert and oriented to person, place, and time. She displays no atrophy and no tremor. No cranial nerve deficit. She exhibits normal muscle tone. She displays a negative Romberg sign. She displays no seizure activity. Coordination and gait normal.  Reflex Scores:      Tricep reflexes are 2+ on the right side and 2+ on the left side.      Bicep reflexes are 2+ on the right side and 2+ on the left side.      Brachioradialis reflexes are 2+ on the right side and 2+ on the left side.      Patellar reflexes are 2+ on the right side and 2+ on the left side.      Achilles reflexes are 2+ on the right side and 2+ on the left side.  Speech fluent without evidence of aphasia. Able to follow 2 step commands without difficulty.   Normal HTS and FNF tests.  Right lower extremity exam: Strength of hip flexion is 4/5, strength of hip extension, abduction, adduction, knee flexion and extension, plantarflexion and dorsiflexion is 5/5.   LLE strength 5/5  BUE strength 5/5  Sensation to light, sharp, and dull  touch in b/l upper and lower extremities.   Skin: Skin is warm and dry.  Psychiatric: She has a normal mood and affect.  Vitals reviewed.  Results for orders placed or performed in visit on 10/22/17 (from the past 24 hour(s))  POCT glucose (manual entry)     Status: Abnormal   Collection Time: 10/22/17  1:31 PM  Result Value Ref Range   POC Glucose 160 (A) 70 - 99 mg/dl     Assessment and Plan :  1. Right leg weakness This case was precepted with Dr. Mitchel Honour.  Due to sudden onset of right-sided leg weakness with chronic comorbidities, cannot rule out stroke at this time.  Patient does have decreased strength with hip flexion, otherwise normal neuro exam.  Lungs CTAB.  Heart RRR.  Point-of-care glucose 160, patient just ate.  Otherwise, overall well-appearing and in no acute distress.  BP mildly elevated at 153/71.  Recommend emergent evaluation by ED.  Patient refuses transport via EMS.  She drove herself here.  She reports she will go home and get her husband and then have him drive her to the ED.  Educated on potential complications of driving herself home at this time.  She voices her understanding and agrees to drive herself AMA. - POCT glucose (manual entry) 2. Essential hypertension 3. Type 2 diabetes mellitus without complication, without long-term current use of insulin ([place]) 4. Hyperlipidemia, unspecified hyperlipidemia type   Tenna Delaine PA-C  [facility] [place] 10/22/2017 1:29 PM

## 2017-10-27 ENCOUNTER — Encounter: Payer: Self-pay | Admitting: Family Medicine

## 2017-10-27 LAB — HM DIABETES EYE EXAM

## 2017-11-26 DIAGNOSIS — H52209 Unspecified astigmatism, unspecified eye: Secondary | ICD-10-CM | POA: Diagnosis not present

## 2017-11-26 DIAGNOSIS — H524 Presbyopia: Secondary | ICD-10-CM | POA: Diagnosis not present

## 2017-11-26 DIAGNOSIS — H5203 Hypermetropia, bilateral: Secondary | ICD-10-CM | POA: Diagnosis not present

## 2018-01-12 ENCOUNTER — Other Ambulatory Visit: Payer: Self-pay

## 2018-01-12 ENCOUNTER — Encounter: Payer: Self-pay | Admitting: Family Medicine

## 2018-01-12 ENCOUNTER — Ambulatory Visit (INDEPENDENT_AMBULATORY_CARE_PROVIDER_SITE_OTHER): Payer: Medicare HMO | Admitting: Family Medicine

## 2018-01-12 VITALS — BP 134/70 | HR 70 | Temp 97.9°F | Resp 16 | Ht 67.21 in | Wt 192.6 lb

## 2018-01-12 DIAGNOSIS — Z1159 Encounter for screening for other viral diseases: Secondary | ICD-10-CM | POA: Diagnosis not present

## 2018-01-12 DIAGNOSIS — I10 Essential (primary) hypertension: Secondary | ICD-10-CM

## 2018-01-12 DIAGNOSIS — E782 Mixed hyperlipidemia: Secondary | ICD-10-CM

## 2018-01-12 DIAGNOSIS — Z8619 Personal history of other infectious and parasitic diseases: Secondary | ICD-10-CM | POA: Diagnosis not present

## 2018-01-12 DIAGNOSIS — L301 Dyshidrosis [pompholyx]: Secondary | ICD-10-CM | POA: Diagnosis not present

## 2018-01-12 DIAGNOSIS — M818 Other osteoporosis without current pathological fracture: Secondary | ICD-10-CM | POA: Diagnosis not present

## 2018-01-12 DIAGNOSIS — E119 Type 2 diabetes mellitus without complications: Secondary | ICD-10-CM

## 2018-01-12 DIAGNOSIS — Z1211 Encounter for screening for malignant neoplasm of colon: Secondary | ICD-10-CM | POA: Diagnosis not present

## 2018-01-12 LAB — POCT GLYCOSYLATED HEMOGLOBIN (HGB A1C): Hemoglobin A1C: 6.6 % — AB (ref 4.0–5.6)

## 2018-01-12 MED ORDER — ZOSTER VAC RECOMB ADJUVANTED 50 MCG/0.5ML IM SUSR: 0.5000 mL | Freq: Once | INTRAMUSCULAR | 0 refills | Status: AC

## 2018-01-12 MED ORDER — TRIAMCINOLONE ACETONIDE 0.1 % EX CREA: 1.0000 "application " | TOPICAL_CREAM | Freq: Two times a day (BID) | CUTANEOUS | 1 refills | Status: DC | PRN

## 2018-01-12 NOTE — Patient Instructions (Signed)
° ° ° °  If you have lab work done today you will be contacted with your lab results within the next 2 weeks.  If you have not heard from us then please contact us. The fastest way to get your results is to register for My Chart. ° ° °IF you received an x-ray today, you will receive an invoice from [facility]. Please contact [facility] at [phone] with questions or concerns regarding your invoice.  ° °IF you received labwork today, you will receive an invoice from [facility]. Please contact [facility] at [phone] with questions or concerns regarding your invoice.  ° °Our billing staff will not be able to assist you with questions regarding bills from these companies. ° °You will be contacted with the lab results as soon as they are available. The fastest way to get your results is to activate your My Chart account. Instructions are located on the last page of this paperwork. If you have not heard from us regarding the results in 2 weeks, please contact this office. °  ° ° ° °

## 2018-01-12 NOTE — Progress Notes (Signed)
Chief Complaint  Patient presents with  . diabetes/cholesterol    6 month follow up    HPI  Diabetes Mellitus: Patient presents for follow up of diabetes. Symptoms: none.  Patient denies foot ulcerations, hyperglycemia, hypoglycemia , increase appetite, nausea, paresthesia of the feet, polydipsia and polyuria.  Evaluation to date has been included: hemoglobin A1C.  Home sugars: BGs consistently in an acceptable range. Treatment to date: Continued metformin which has been effective.  Lab Results  Component Value Date   HGBA1C 6.6 (A) 01/12/2018    Hypertension: Patient here for follow-up of elevated blood pressure. She is exercising and is adherent to low salt diet.  Blood pressure is well controlled at home. Cardiac symptoms none. Patient denies chest pain, chest pressure/discomfort, claudication, dyspnea, exertional chest pressure/discomfort, fatigue, irregular heart beat and lower extremity edema.  Cardiovascular risk factors: diabetes mellitus, dyslipidemia and hypertension.  BP Readings from Last 3 Encounters:  01/12/18 134/70  10/22/17 (!) 184/61  10/22/17 (!) 153/71     Dyslipidemia: Patient presents for evaluation of lipids.  Compliance with treatment thus far has been good.  A repeat fasting lipid profile was done.  The patient does not use medications that may worsen dyslipidemias (corticosteroids, progestins, anabolic steroids, diuretics, beta-blockers, amiodarone, cyclosporine, olanzapine). The patient exercises intermittently.  The patient is not known to have coexisting coronary artery disease.    Lab Results  Component Value Date   CHOL 140 06/22/2017   CHOL 135 12/23/2016   CHOL 130 06/20/2016   Lab Results  Component Value Date   HDL 37 (L) 06/22/2017   HDL 34 (L) 12/23/2016   HDL 37 (L) 06/20/2016   Lab Results  Component Value Date   LDLCALC 86 06/22/2017   LDLCALC 84 12/23/2016   LDLCALC 78 06/20/2016   Lab Results  Component Value Date   TRIG 85  06/22/2017   TRIG 85 12/23/2016   TRIG 73 06/20/2016   Lab Results  Component Value Date   CHOLHDL 3.8 06/22/2017   CHOLHDL 4.0 12/23/2016   CHOLHDL 3.5 06/20/2016   No results found for: LDLDIRECT  Osteoporosis Pt is exercising by walking up and down hills 3-4 times a week She reports that she has not bone pain or fractures She is tolerating her fosamax No side effects She takes calcium and vitamin D Next bone density due after April 2020  Past Medical History:  Diagnosis Date  . Diabetes mellitus without complication ([place])   . Hyperlipidemia   . Hypertension   . Osteoporosis     Current Outpatient Medications  Medication Sig Dispense Refill  . alendronate (FOSAMAX) 70 MG tablet Take 1 tablet (70 mg total) by mouth once a week. Take on the same day each week 12 tablet 3  . aspirin 81 MG tablet Take 81 mg by mouth daily.     Marland Kitchen atorvastatin (LIPITOR) 40 MG tablet Take 1 tablet (40 mg total) by mouth daily. 90 tablet 1  . losartan (COZAAR) 50 MG tablet Take 1 tablet (50 mg total) by mouth daily. 90 tablet 1  . metFORMIN (GLUCOPHAGE-XR) 500 MG 24 hr tablet Take 1 tablet (500 mg total) by mouth daily. 90 tablet 1  . naproxen sodium (ALEVE) 220 MG tablet Take 220 mg by mouth daily as needed (pain).    . triamcinolone cream (KENALOG) 0.1 % Apply 1 application topically 2 (two) times daily as needed (dry skin). 80 g 1  . Zoster Vaccine Adjuvanted [facility]) injection Inject 0.5 mLs into the muscle  once for 1 dose. 0.5 mL 0   No current facility-administered medications for this visit.     Allergies: No Known Allergies  Past Surgical History:  Procedure Laterality Date  . LUMBAR DISC SURGERY  4010   no complications  . SPINE SURGERY     lumbar  . TUBAL LIGATION      Social History   Socioeconomic History  . Marital status: Married    Spouse name: Richard  . Number of children: 1  . Years of education: 23  . Highest education level: High school graduate    Occupational History  . Occupation: Youth worker  Social Needs  . Financial resource strain: Not hard at all  . Food insecurity:    Worry: Never true    Inability: Never true  . Transportation needs:    Medical: No    Non-medical: No  Tobacco Use  . Smoking status: Passive Smoke Exposure - Never Smoker  . Smokeless tobacco: Never Used  Substance and Sexual Activity  . Alcohol use: Not Currently  . Drug use: No  . Sexual activity: Yes    Birth control/protection: None  Lifestyle  . Physical activity:    Days per week: 3 days    Minutes per session: 60 min  . Stress: Not at all  Relationships  . Social connections:    Talks on phone: More than three times a week    Gets together: More than three times a week    Attends religious service: Never    Active member of club or organization: No    Attends meetings of clubs or organizations: Never    Relationship status: Married  Other Topics Concern  . Not on file  Social History Narrative   Lives with husband.    Family History  Problem Relation Age of Onset  . Stroke Mother      ROS Review of Systems See HPI Constitution: No fevers or chills No malaise No diaphoresis Skin: No rash or itching Eyes: no blurry vision, no double vision GU: no dysuria or hematuria Neuro: no dizziness or headaches all others reviewed and negative   Objective: Vitals:   01/12/18 0833 01/12/18 0919  BP: (!) 158/84 134/70  Pulse: 70   Resp: 16   Temp: 97.9 F (36.6 C)   TempSrc: Oral   SpO2: 95%   Weight: 192 lb 9.6 oz (87.4 kg)   Height: 5' 7.21" (1.707 m)     Physical Exam  Constitutional: She is oriented to person, place, and time. She appears well-developed and well-nourished.  HENT:  Head: Normocephalic and atraumatic.  Eyes: Conjunctivae and EOM are normal.  Neck: Normal range of motion. Neck supple.  Cardiovascular: Normal rate, regular rhythm and normal heart sounds.  No murmur heard. Pulmonary/Chest:  Effort normal and breath sounds normal. No stridor. No respiratory distress. She has no wheezes.  Musculoskeletal: Normal range of motion. She exhibits no edema.  Neurological: She is alert and oriented to person, place, and time.  Skin: Skin is warm. Capillary refill takes less than 2 seconds.  Psychiatric: She has a normal mood and affect. Her behavior is normal. Judgment and thought content normal.    Assessment and Plan Kristen Pratt was seen today for diabetes/cholesterol.  Diagnoses and all orders for this visit:  Type 2 diabetes mellitus without complication, without long-term current use of insulin ([place])- well controlled hemoglobin a1c is at goal Continue exercise Lipids monitored and renal function in range On metformin On arb On  asa 81mg  Reviewed diabetic foot care Emphasized importance of eye and dental exam    -     POCT glycosylated hemoglobin (Hb A1C) -     Comprehensive metabolic panel -     Lipid panel  Special screening for malignant neoplasms, colon -     Cologuard  Essential hypertension- Patient's blood pressure is at goal of 139/89 or less. Condition is stable. Continue current medications and treatment plan. I recommend that you exercise for 30-45 minutes 5 days a week. I also recommend a balanced diet with fruits and vegetables every day, lean meats, and little fried foods. The DASH diet (you can find this online) is a good example of this.  -     Comprehensive metabolic panel -     Lipid panel  Mixed hyperlipidemia- continue current plan -     Comprehensive metabolic panel -     Lipid panel  Encounter for hepatitis C screening test for low risk patient -     HCV Ab w/Rflx to Verification  History of shingles -     Zoster Vaccine Adjuvanted [facility]) injection; Inject 0.5 mLs into the muscle once for 1 dose.  Eczema, dyshidrotic -     triamcinolone cream (KENALOG) 0.1 %; Apply 1 application topically 2 (two) times daily as needed (dry skin).  Other  osteoporosis without current pathological fracture- continue weight bearing exercises Next bone density exam in [place]

## 2018-01-13 LAB — LIPID PANEL
CHOLESTEROL TOTAL: 138 mg/dL (ref 100–199)
Chol/HDL Ratio: 4.2 ratio (ref 0.0–4.4)
HDL: 33 mg/dL — ABNORMAL LOW (ref >39)
LDL CALC: 86 mg/dL (ref 0–99)
TRIGLYCERIDES: 95 mg/dL (ref 0–149)
VLDL Cholesterol Cal: 19 mg/dL (ref 5–40)

## 2018-01-13 LAB — COMPREHENSIVE METABOLIC PANEL
ALBUMIN: 4.4 g/dL (ref 3.5–4.8)
ALK PHOS: 91 IU/L (ref 39–117)
ALT: 17 IU/L (ref 0–32)
AST: 19 IU/L (ref 0–40)
Albumin/Globulin Ratio: 1.7 (ref 1.2–2.2)
BUN/Creatinine Ratio: 24 (ref 12–28)
BUN: 19 mg/dL (ref 8–27)
Bilirubin Total: 0.5 mg/dL (ref 0.0–1.2)
CALCIUM: 9.8 mg/dL (ref 8.7–10.3)
CHLORIDE: 102 mmol/L (ref 96–106)
CO2: 23 mmol/L (ref 20–29)
CREATININE: 0.78 mg/dL (ref 0.57–1.00)
GFR calc non Af Amer: 77 mL/min/{1.73_m2} (ref >59)
GFR, EST AFRICAN AMERICAN: 89 mL/min/{1.73_m2} (ref >59)
GLUCOSE: 115 mg/dL — AB (ref 65–99)
Globulin, Total: 2.6 g/dL (ref 1.5–4.5)
Potassium: 4.2 mmol/L (ref 3.5–5.2)
SODIUM: 141 mmol/L (ref 134–144)
TOTAL PROTEIN: 7 g/dL (ref 6.0–8.5)

## 2018-01-13 LAB — HCV INTERPRETATION

## 2018-01-13 LAB — HCV AB W/RFLX TO VERIFICATION: HCV Ab: <0.1 s/co ratio (ref 0.0–0.9)

## 2018-01-18 DIAGNOSIS — Z1211 Encounter for screening for malignant neoplasm of colon: Secondary | ICD-10-CM | POA: Diagnosis not present

## 2018-01-25 LAB — COLOGUARD: Cologuard: NEGATIVE

## 2018-04-27 DIAGNOSIS — B373 Candidiasis of vulva and vagina: Secondary | ICD-10-CM | POA: Diagnosis not present

## 2018-04-27 DIAGNOSIS — N898 Other specified noninflammatory disorders of vagina: Secondary | ICD-10-CM | POA: Diagnosis not present

## 2018-04-27 DIAGNOSIS — L940 Localized scleroderma [morphea]: Secondary | ICD-10-CM | POA: Diagnosis not present

## 2018-04-27 DIAGNOSIS — Z124 Encounter for screening for malignant neoplasm of cervix: Secondary | ICD-10-CM | POA: Diagnosis not present

## 2018-06-02 DIAGNOSIS — L570 Actinic keratosis: Secondary | ICD-10-CM | POA: Diagnosis not present

## 2018-06-02 DIAGNOSIS — C44329 Squamous cell carcinoma of skin of other parts of face: Secondary | ICD-10-CM | POA: Diagnosis not present

## 2018-06-02 DIAGNOSIS — D485 Neoplasm of uncertain behavior of skin: Secondary | ICD-10-CM | POA: Diagnosis not present

## 2018-06-14 ENCOUNTER — Telehealth: Payer: Self-pay | Admitting: *Deleted

## 2018-06-14 NOTE — Telephone Encounter (Signed)
Schedule AWV.  

## 2018-06-29 DIAGNOSIS — C44329 Squamous cell carcinoma of skin of other parts of face: Secondary | ICD-10-CM | POA: Diagnosis not present

## 2018-06-29 DIAGNOSIS — L570 Actinic keratosis: Secondary | ICD-10-CM | POA: Diagnosis not present

## 2018-06-29 DIAGNOSIS — D485 Neoplasm of uncertain behavior of skin: Secondary | ICD-10-CM | POA: Diagnosis not present

## 2018-07-12 ENCOUNTER — Telehealth: Payer: Self-pay | Admitting: *Deleted

## 2018-07-12 NOTE — Telephone Encounter (Signed)
Schedule AWV.  

## 2018-08-11 DIAGNOSIS — D0439 Carcinoma in situ of skin of other parts of face: Secondary | ICD-10-CM | POA: Diagnosis not present

## 2018-08-11 DIAGNOSIS — L570 Actinic keratosis: Secondary | ICD-10-CM | POA: Diagnosis not present

## 2018-08-15 ENCOUNTER — Other Ambulatory Visit: Payer: Self-pay

## 2018-08-15 ENCOUNTER — Encounter: Payer: Self-pay | Admitting: Family Medicine

## 2018-08-15 ENCOUNTER — Ambulatory Visit (INDEPENDENT_AMBULATORY_CARE_PROVIDER_SITE_OTHER): Payer: Medicare HMO | Admitting: Family Medicine

## 2018-08-15 VITALS — BP 142/68 | HR 65 | Temp 98.2°F | Resp 16 | Ht 67.21 in | Wt 190.0 lb

## 2018-08-15 DIAGNOSIS — E785 Hyperlipidemia, unspecified: Secondary | ICD-10-CM

## 2018-08-15 DIAGNOSIS — M810 Age-related osteoporosis without current pathological fracture: Secondary | ICD-10-CM | POA: Diagnosis not present

## 2018-08-15 DIAGNOSIS — E119 Type 2 diabetes mellitus without complications: Secondary | ICD-10-CM | POA: Diagnosis not present

## 2018-08-15 DIAGNOSIS — Z0001 Encounter for general adult medical examination with abnormal findings: Secondary | ICD-10-CM

## 2018-08-15 DIAGNOSIS — Z0000 Encounter for general adult medical examination without abnormal findings: Secondary | ICD-10-CM

## 2018-08-15 DIAGNOSIS — L301 Dyshidrosis [pompholyx]: Secondary | ICD-10-CM

## 2018-08-15 DIAGNOSIS — I10 Essential (primary) hypertension: Secondary | ICD-10-CM

## 2018-08-15 DIAGNOSIS — Z1231 Encounter for screening mammogram for malignant neoplasm of breast: Secondary | ICD-10-CM | POA: Diagnosis not present

## 2018-08-15 LAB — POCT URINALYSIS DIP (MANUAL ENTRY)
Bilirubin, UA: NEGATIVE
Blood, UA: NEGATIVE
Glucose, UA: NEGATIVE mg/dL
Ketones, POC UA: NEGATIVE mg/dL
Leukocytes, UA: NEGATIVE
Nitrite, UA: NEGATIVE
Protein Ur, POC: NEGATIVE mg/dL
Spec Grav, UA: >=1.03 — AB (ref 1.010–1.025)
Urobilinogen, UA: 0.2 E.U./dL
pH, UA: 5.5 (ref 5.0–8.0)

## 2018-08-15 LAB — HM DEXA SCAN

## 2018-08-15 LAB — POCT GLYCOSYLATED HEMOGLOBIN (HGB A1C): Hemoglobin A1C: 6.8 % — AB (ref 4.0–5.6)

## 2018-08-15 LAB — HM MAMMOGRAPHY

## 2018-08-15 MED ORDER — TRIAMCINOLONE ACETONIDE 0.1 % EX CREA: 1.0000 "application " | TOPICAL_CREAM | Freq: Two times a day (BID) | CUTANEOUS | 1 refills | Status: AC | PRN

## 2018-08-15 MED ORDER — ATORVASTATIN CALCIUM 40 MG PO TABS: 40.0000 mg | ORAL_TABLET | Freq: Every day | ORAL | 1 refills | Status: AC

## 2018-08-15 MED ORDER — METFORMIN HCL ER 500 MG PO TB24: 500.0000 mg | ORAL_TABLET | Freq: Every day | ORAL | 1 refills | Status: AC

## 2018-08-15 MED ORDER — LOSARTAN POTASSIUM 50 MG PO TABS: 50.0000 mg | ORAL_TABLET | Freq: Every day | ORAL | 1 refills | Status: AC

## 2018-08-15 NOTE — Patient Instructions (Addendum)
   If you have lab work done today you will be contacted with your lab results within the next 2 weeks.  If you have not heard from us then please contact us. The fastest way to get your results is to register for My Chart.   IF you received an x-ray today, you will receive an invoice from [facility]. Please contact [facility] at [phone] with questions or concerns regarding your invoice.   IF you received labwork today, you will receive an invoice from [facility]. Please contact [facility] at [phone] with questions or concerns regarding your invoice.   Our billing staff will not be able to assist you with questions regarding bills from these companies.  You will be contacted with the lab results as soon as they are available. The fastest way to get your results is to activate your My Chart account. Instructions are located on the last page of this paperwork. If you have not heard from us regarding the results in 2 weeks, please contact this office.     Health Maintenance After Age 65 After age 65, you are at a higher risk for certain long-term diseases and infections as well as injuries from falls. Falls are a major cause of broken bones and head injuries in people who are older than age 65. Getting regular preventive care can help to keep you healthy and well. Preventive care includes getting regular testing and making lifestyle changes as recommended by your health care provider. Talk with your health care provider about:  Which screenings and tests you should have. A screening is a test that checks for a disease when you have no symptoms.  A diet and exercise plan that is right for you. What should I know about screenings and tests to prevent falls? Screening and testing are the best ways to find a health problem early. Early diagnosis and treatment give you the best chance of managing medical conditions that are common after age 65. Certain conditions and  lifestyle choices may make you more likely to have a fall. Your health care provider may recommend:  Regular vision checks. Poor vision and conditions such as cataracts can make you more likely to have a fall. If you wear glasses, make sure to get your prescription updated if your vision changes.  Medicine review. Work with your health care provider to regularly review all of the medicines you are taking, including over-the-counter medicines. Ask your health care provider about any side effects that may make you more likely to have a fall. Tell your health care provider if any medicines that you take make you feel dizzy or sleepy.  Osteoporosis screening. Osteoporosis is a condition that causes the bones to get weaker. This can make the bones weak and cause them to break more easily.  Blood pressure screening. Blood pressure changes and medicines to control blood pressure can make you feel dizzy.  Strength and balance checks. Your health care provider may recommend certain tests to check your strength and balance while standing, walking, or changing positions.  Foot health exam. Foot pain and numbness, as well as not wearing proper footwear, can make you more likely to have a fall.  Depression screening. You may be more likely to have a fall if you have a fear of falling, feel emotionally low, or feel unable to do activities that you used to do.  Alcohol use screening. Using too much alcohol can affect your balance and may make you more likely to   have a fall. What actions can I take to lower my risk of falls? General instructions  Talk with your health care provider about your risks for falling. Tell your health care provider if: ? You fall. Be sure to tell your health care provider about all falls, even ones that seem minor. ? You feel dizzy, sleepy, or off-balance.  Take over-the-counter and prescription medicines only as told by your health care provider. These include any  supplements.  Eat a healthy diet and maintain a healthy weight. A healthy diet includes low-fat dairy products, low-fat (lean) meats, and fiber from whole grains, beans, and lots of fruits and vegetables. Home safety  Remove any tripping hazards, such as rugs, cords, and clutter.  Install safety equipment such as grab bars in bathrooms and safety rails on stairs.  Keep rooms and walkways well-lit. Activity   Follow a regular exercise program to stay fit. This will help you maintain your balance. Ask your health care provider what types of exercise are appropriate for you.  If you need a cane or walker, use it as recommended by your health care provider.  Wear supportive shoes that have nonskid soles. Lifestyle  Do not drink alcohol if your health care provider tells you not to drink.  If you drink alcohol, limit how much you have: ? 0-1 drink a day for women. ? 0-2 drinks a day for men.  Be aware of how much alcohol is in your drink. In the [place] one drink equals one typical bottle of beer (12 oz), one-half glass of wine (5 oz), or one shot of hard liquor (1 oz).  Do not use any products that contain nicotine or tobacco, such as cigarettes and e-cigarettes. If you need help quitting, ask your health care provider. Summary  Having a healthy lifestyle and getting preventive care can help to protect your health and wellness after age 82.  Screening and testing are the best way to find a health problem early and help you avoid having a fall. Early diagnosis and treatment give you the best chance for managing medical conditions that are more common for people who are older than age 68.  Falls are a major cause of broken bones and head injuries in people who are older than age 79. Take precautions to prevent a fall at home.  Work with your health care provider to learn what changes you can make to improve your health and wellness and to prevent falls. This information is not intended  to replace advice given to you by your health care provider. Make sure you discuss any questions you have with your health care provider. Document Released: 01/06/2017 Document Revised: 01/06/2017 Document Reviewed: 01/06/2017 Elsevier Interactive Patient Education  2019 [employer].

## 2018-08-15 NOTE — Progress Notes (Signed)
QUICK REFERENCE INFORMATION: The ABCs of Providing the Annual Wellness Visit  CMS.gov Medicare Learning Network  [employer] Annual Wellness Visit  Subjective:   Kristen Pratt is a 73 y.o. Female who presents for an Annual Wellness Visit.  Patient Active Problem List   Diagnosis Date Noted   Osteoporosis 07/14/2016   Type 2 diabetes mellitus without complication, without long-term current use of insulin ([place]) 06/20/2016   Actinic keratoses 06/20/2016   Eczema, dyshidrotic 02/20/2012   Hyperlipidemia 04/12/2011   Hypertension 04/12/2011    Past Medical History:  Diagnosis Date   Diabetes mellitus without complication ([place])    Hyperlipidemia    Hypertension    Osteoporosis      Past Surgical History:  Procedure Laterality Date   LUMBAR DISC SURGERY  3762   no complications   SPINE SURGERY     lumbar   TUBAL LIGATION       Outpatient Medications Prior to Visit  Medication Sig Dispense Refill   aspirin 81 MG tablet Take 81 mg by mouth daily.      naproxen sodium (ALEVE) 220 MG tablet Take 220 mg by mouth daily as needed (pain).     atorvastatin (LIPITOR) 40 MG tablet Take 1 tablet (40 mg total) by mouth daily. 90 tablet 1   losartan (COZAAR) 50 MG tablet Take 1 tablet (50 mg total) by mouth daily. 90 tablet 1   metFORMIN (GLUCOPHAGE-XR) 500 MG 24 hr tablet Take 1 tablet (500 mg total) by mouth daily. 90 tablet 1   triamcinolone cream (KENALOG) 0.1 % Apply 1 application topically 2 (two) times daily as needed (dry skin). 80 g 1   No facility-administered medications prior to visit.     No Known Allergies   Family History  Problem Relation Age of Onset   Stroke Mother      Social History   Socioeconomic History   Marital status: Married    Spouse name: Richard   Number of children: 1   Years of education: 13   Highest education level: High school graduate  Occupational History   Occupation: Engineer, civil (consulting) strain: Not hard at all   Food insecurity:    Worry: Never true    Inability: Never true   Transportation needs:    Medical: No    Non-medical: No  Tobacco Use   Smoking status: Passive Smoke Exposure - Never Smoker   Smokeless tobacco: Never Used  Substance and Sexual Activity   Alcohol use: Not Currently   Drug use: No   Sexual activity: Yes    Birth control/protection: None  Lifestyle   Physical activity:    Days per week: 3 days    Minutes per session: 60 min   Stress: Not at all  Relationships   Social connections:    Talks on phone: More than three times a week    Gets together: More than three times a week    Attends religious service: Never    Active member of club or organization: No    Attends meetings of clubs or organizations: Never    Relationship status: Married  Other Topics Concern   Not on file  Social History Narrative   Lives with husband.      Recent Hospitalizations? No  Health Habits: Current exercise activities include: walking Exercise: 3 times/week. Diet: in general, a "healthy" diet    Alcohol intake: none  Health Risk Assessment: The patient has completed a Health Risk  Assessment. This has been reveiwed with them and has been scanned into the [facility] system as an attached document.  Current Medical Providers and Suppliers: [facility] Patient Care Team: Forrest Moron, MD as PCP - General (Internal Medicine) No future appointments.   Age-appropriate Screening Schedule: The list below includes current immunization status and future screening recommendations based on patient's age. Orders for these recommended tests are listed in the plan section. The patient has been provided with a written plan. Immunization History  Administered Date(s) Administered   Hpv 01/07/2014   Influenza, High Dose Seasonal PF 12/14/2016, 12/14/2016   Influenza-Unspecified 01/29/2014, 12/13/2014, 11/27/2015, 12/14/2016    Pneumococcal Conjugate-13 12/13/2014   Pneumococcal Polysaccharide-23 01/07/2014   Td 08/07/2004   Tdap 05/15/2013   Zoster Recombinat (Shingrix) 05/06/2018    Health Maintenance reviewed -   Pap smear done with Gynecology Mammogram and bone density scheduled 08/15/2018 at [facility] vaccine completed Hep C screening completed  Depression Screen-PHQ2/9 completed today  Depression screen [facility] 2/9 08/15/2018 01/12/2018 07/07/2017 06/22/2017 12/23/2016  Decreased Interest 0 0 0 0 0  Down, Depressed, Hopeless 0 0 0 0 0  PHQ - 2 Score 0 0 0 0 0       Depression Severity and Treatment Recommendations:  0-4= None  5-9= Mild / Treatment: Support, educate to call if worse; return in one month  10-14= Moderate / Treatment: Support, watchful waiting; Antidepressant or Psycotherapy  15-19= Moderately severe / Treatment: Antidepressant OR Psychotherapy  >= 20 = Major depression, severe / Antidepressant AND Psychotherapy  Functional Status Survey:   Is the patient deaf or have difficulty hearing?: No Does the patient have difficulty seeing, even when wearing glasses/contacts?: No Does the patient have difficulty concentrating, remembering, or making decisions?: No Does the patient have difficulty walking or climbing stairs?: No Does the patient have difficulty dressing or bathing?: No Does the patient have difficulty doing errands alone such as visiting a doctor's office or shopping?: No    Advanced Care Planning: 1. Patient has executed an Advance Directive: Yes 2. If no, patient was given the opportunity to execute an Advance Directive today? Yes 3. Are the patient's advanced directives in [place]? No 4. This patient has the ability to prepare an Advance Directive: Yes 5. Provider is willing to follow the patient's wishes: Yes  Cognitive Assessment: Does the patient have evidence of cognitive impairment? No The patient does not have any evidence of any cognitive problems and denies  any  change in mood/affect, appearance, speech, memory or motor skills.   Identification of Risk Factors: Risk factors include: diabetes mellitus and hypertension  ROS   Review of Systems  Constitutional: Negative for activity change, appetite change, chills and fever.  HENT: Negative for congestion, nosebleeds, trouble swallowing and voice change.   Respiratory: Negative for cough, shortness of breath and wheezing.   Gastrointestinal: Negative for diarrhea, nausea and vomiting.  Genitourinary: Negative for difficulty urinating, dysuria, flank pain and hematuria.  Musculoskeletal: Negative for back pain, joint swelling and neck pain.  Neurological: Negative for dizziness, speech difficulty, light-headedness and numbness.  See HPI. All other review of systems negative.    Objective:   Vitals:   08/15/18 0807  BP: (!) 142/68  Pulse: 65  Resp: 16  Temp: 98.2 F (36.8 C)  TempSrc: Oral  SpO2: 99%  Weight: 190 lb (86.2 kg)  Height: 5' 7.21" (1.707 m)   Wt Readings from Last 3 Encounters:  08/15/18 190 lb (86.2 kg)  01/12/18 192 lb  9.6 oz (87.4 kg)  10/22/17 191 lb 12.8 oz (87 kg)     Body mass index is 29.57 kg/m.  Physical Exam Constitutional:      Appearance: Normal appearance.  HENT:     Head: Normocephalic and atraumatic.     Right Ear: Tympanic membrane normal.     Left Ear: Tympanic membrane normal.  Eyes:     General: No scleral icterus.    Conjunctiva/sclera: Conjunctivae normal.  Neck:     Musculoskeletal: Normal range of motion and neck supple.  Cardiovascular:     Rate and Rhythm: Normal rate and regular rhythm.     Pulses: Normal pulses.  Pulmonary:     Effort: Pulmonary effort is normal. No respiratory distress.     Breath sounds: Normal breath sounds. No stridor. No wheezing.  Chest:     Breasts:        Right: Normal. No swelling, bleeding, inverted nipple, mass, nipple discharge, skin change or tenderness.        Left: Normal. No swelling,  bleeding, inverted nipple, mass, nipple discharge, skin change or tenderness.  Abdominal:     General: Abdomen is flat. Bowel sounds are normal. There is no distension.     Palpations: Abdomen is soft. There is no mass.     Tenderness: There is no abdominal tenderness. There is no guarding or rebound.     Hernia: No hernia is present.  Musculoskeletal: Normal range of motion.        General: No swelling or tenderness.  Lymphadenopathy:     Upper Body:     Right upper body: No supraclavicular or axillary adenopathy.     Left upper body: No supraclavicular or axillary adenopathy.  Skin:    General: Skin is warm.  Neurological:     General: No focal deficit present.     Mental Status: She is alert and oriented to person, place, and time.  Psychiatric:        Mood and Affect: Mood normal.        Behavior: Behavior normal.        Thought Content: Thought content normal.        Judgment: Judgment normal.    Skin - fingers with small scaly skin changes    Assessment/Plan:   Patient Self-Management and Personalized Health Advice The patient has been provided with information about:  reduce exposure to stress, continue current medications, continue current healthy lifestyle patterns and return for routine annual checkups  During the course of the visit the patient was educated and counseled about appropriate screening and preventive services including:   lab testing as noted in orders section, medication refills are given     Body mass index is 29.57 kg/m. Discussed the patient's BMI with her. The BMI BMI is not in the acceptable range; BMI management plan is completed  Jalie was seen today for annual exam and medication refill.  Diagnoses and all orders for this visit:  Encounter for Medicare annual wellness exam-  Women's Health Maintenance Plan Advised monthly breast exam and annual mammogram Pt getting mammogram today Advised dental exam every six months Discussed  stress management Discussed pap smear screening guidelines- her pap smear is up to date    Essential hypertension- pt fasting this morning, advised her to continue her current dose of bp meds -     Lipid panel -     Comprehensive metabolic panel -     losartan (COZAAR) 50 MG tablet; Take 1  tablet (50 mg total) by mouth daily.  Type 2 diabetes mellitus without complication, without long-term current use of insulin (HCC)- stable, cpm -     POCT glycosylated hemoglobin (Hb A1C) -     HM Diabetes Foot Exam -     Comprehensive metabolic panel -     POCT urinalysis dipstick -     metFORMIN (GLUCOPHAGE-XR) 500 MG 24 hr tablet; Take 1 tablet (500 mg total) by mouth daily.  Hyperlipidemia, unspecified hyperlipidemia type -     atorvastatin (LIPITOR) 40 MG tablet; Take 1 tablet (40 mg total) by mouth daily.  Eczema, dyshidrotic -     triamcinolone cream (KENALOG) 0.1 %; Apply 1 application topically 2 (two) times daily as needed (dry skin).   Results should be mailed in a letter format   Return in about 6 months (around 02/14/2019) for diabetes.  No future appointments.  Patient Instructions       If you have lab work done today you will be contacted with your lab results within the next 2 weeks.  If you have not heard from [place] then please contact us. The fastest way to get your results is to register for My Chart.   IF you received an x-ray today, you will receive an invoice from [facility]. Please contact [facility] at [phone] with questions or concerns regarding your invoice.   IF you received labwork today, you will receive an invoice from [facility]. Please contact [facility] at [phone] with questions or concerns regarding your invoice.   Our billing staff will not be able to assist you with questions regarding bills from these companies.  You will be contacted with the lab results as soon as they are available. The fastest way to get your results is  to activate your My Chart account. Instructions are located on the last page of this paperwork. If you have not heard from [place] regarding the results in 2 weeks, please contact this office.     Health Maintenance After Age 16 After age 34, you are at a higher risk for certain long-term diseases and infections as well as injuries from falls. Falls are a major cause of broken bones and head injuries in people who are older than age 8. Getting regular preventive care can help to keep you healthy and well. Preventive care includes getting regular testing and making lifestyle changes as recommended by your health care provider. Talk with your health care provider about:  Which screenings and tests you should have. A screening is a test that checks for a disease when you have no symptoms.  A diet and exercise plan that is right for you. What should I know about screenings and tests to prevent falls? Screening and testing are the best ways to find a health problem early. Early diagnosis and treatment give you the best chance of managing medical conditions that are common after age 33. Certain conditions and lifestyle choices may make you more likely to have a fall. Your health care provider may recommend:  Regular vision checks. Poor vision and conditions such as cataracts can make you more likely to have a fall. If you wear glasses, make sure to get your prescription updated if your vision changes.  Medicine review. Work with your health care provider to regularly review all of the medicines you are taking, including over-the-counter medicines. Ask your health care provider about any side effects that may make you more likely to have a fall. Tell your health care  provider if any medicines that you take make you feel dizzy or sleepy.  Osteoporosis screening. Osteoporosis is a condition that causes the bones to get weaker. This can make the bones weak and cause them to break more easily.  Blood pressure  screening. Blood pressure changes and medicines to control blood pressure can make you feel dizzy.  Strength and balance checks. Your health care provider may recommend certain tests to check your strength and balance while standing, walking, or changing positions.  Foot health exam. Foot pain and numbness, as well as not wearing proper footwear, can make you more likely to have a fall.  Depression screening. You may be more likely to have a fall if you have a fear of falling, feel emotionally low, or feel unable to do activities that you used to do.  Alcohol use screening. Using too much alcohol can affect your balance and may make you more likely to have a fall. What actions can I take to lower my risk of falls? General instructions  Talk with your health care provider about your risks for falling. Tell your health care provider if: ? You fall. Be sure to tell your health care provider about all falls, even ones that seem minor. ? You feel dizzy, sleepy, or off-balance.  Take over-the-counter and prescription medicines only as told by your health care provider. These include any supplements.  Eat a healthy diet and maintain a healthy weight. A healthy diet includes low-fat dairy products, low-fat (lean) meats, and fiber from whole grains, beans, and lots of fruits and vegetables. Home safety  Remove any tripping hazards, such as rugs, cords, and clutter.  Install safety equipment such as grab bars in bathrooms and safety rails on stairs.  Keep rooms and walkways well-lit. Activity   Follow a regular exercise program to stay fit. This will help you maintain your balance. Ask your health care provider what types of exercise are appropriate for you.  If you need a cane or walker, use it as recommended by your health care provider.  Wear supportive shoes that have nonskid soles. Lifestyle  Do not drink alcohol if your health care provider tells you not to drink.  If you drink  alcohol, limit how much you have: ? 0-1 drink a day for women. ? 0-2 drinks a day for men.  Be aware of how much alcohol is in your drink. In the [place] one drink equals one typical bottle of beer (12 oz), one-half glass of wine (5 oz), or one shot of hard liquor (1 oz).  Do not use any products that contain nicotine or tobacco, such as cigarettes and e-cigarettes. If you need help quitting, ask your health care provider. Summary  Having a healthy lifestyle and getting preventive care can help to protect your health and wellness after age 2.  Screening and testing are the best way to find a health problem early and help you avoid having a fall. Early diagnosis and treatment give you the best chance for managing medical conditions that are more common for people who are older than age 54.  Falls are a major cause of broken bones and head injuries in people who are older than age 45. Take precautions to prevent a fall at home.  Work with your health care provider to learn what changes you can make to improve your health and wellness and to prevent falls. This information is not intended to replace advice given to you by your health care provider.  Make sure you discuss any questions you have with your health care provider. Document Released: 01/06/2017 Document Revised: 01/06/2017 Document Reviewed: 01/06/2017 Elsevier Interactive Patient Education  2019 [employer].    An after visit summary with all of these plans was given to the patient.

## 2018-08-16 LAB — COMPREHENSIVE METABOLIC PANEL
ALT: 15 IU/L (ref 0–32)
AST: 19 IU/L (ref 0–40)
Albumin/Globulin Ratio: 1.7 (ref 1.2–2.2)
Albumin: 4.3 g/dL (ref 3.7–4.7)
Alkaline Phosphatase: 94 IU/L (ref 39–117)
BUN/Creatinine Ratio: 22 (ref 12–28)
BUN: 17 mg/dL (ref 8–27)
Bilirubin Total: 0.4 mg/dL (ref 0.0–1.2)
CO2: 23 mmol/L (ref 20–29)
Calcium: 9.5 mg/dL (ref 8.7–10.3)
Chloride: 103 mmol/L (ref 96–106)
Creatinine, Ser: 0.76 mg/dL (ref 0.57–1.00)
GFR calc Af Amer: 91 mL/min/{1.73_m2} (ref >59)
GFR calc non Af Amer: 79 mL/min/{1.73_m2} (ref >59)
Globulin, Total: 2.5 g/dL (ref 1.5–4.5)
Glucose: 123 mg/dL — ABNORMAL HIGH (ref 65–99)
Potassium: 3.8 mmol/L (ref 3.5–5.2)
Sodium: 140 mmol/L (ref 134–144)
Total Protein: 6.8 g/dL (ref 6.0–8.5)

## 2018-08-16 LAB — LIPID PANEL
Chol/HDL Ratio: 4.6 ratio — ABNORMAL HIGH (ref 0.0–4.4)
Cholesterol, Total: 148 mg/dL (ref 100–199)
HDL: 32 mg/dL — ABNORMAL LOW (ref >39)
LDL Calculated: 100 mg/dL — ABNORMAL HIGH (ref 0–99)
Triglycerides: 80 mg/dL (ref 0–149)
VLDL Cholesterol Cal: 16 mg/dL (ref 5–40)

## 2018-08-22 ENCOUNTER — Encounter: Payer: Self-pay | Admitting: *Deleted

## 2018-09-05 DIAGNOSIS — L03317 Cellulitis of buttock: Secondary | ICD-10-CM | POA: Diagnosis not present

## 2018-09-07 DIAGNOSIS — L03317 Cellulitis of buttock: Secondary | ICD-10-CM | POA: Diagnosis not present

## 2018-09-12 DIAGNOSIS — L03317 Cellulitis of buttock: Secondary | ICD-10-CM | POA: Diagnosis not present

## 2018-09-23 DIAGNOSIS — B373 Candidiasis of vulva and vagina: Secondary | ICD-10-CM | POA: Diagnosis not present

## 2018-09-23 DIAGNOSIS — L940 Localized scleroderma [morphea]: Secondary | ICD-10-CM | POA: Diagnosis not present

## 2018-10-03 ENCOUNTER — Telehealth: Payer: Self-pay | Admitting: Family Medicine

## 2018-10-03 DIAGNOSIS — N200 Calculus of kidney: Secondary | ICD-10-CM

## 2018-10-03 DIAGNOSIS — M818 Other osteoporosis without current pathological fracture: Secondary | ICD-10-CM

## 2018-10-03 NOTE — Telephone Encounter (Signed)
Discussed with patient that her bone density showed continued osteoporosis  She has been on fosamax for 3 years Advised her to see Endocrinology for a consultation to see if there are other options She also has a history of kidney stones. Patient verbalized understanding.

## 2018-12-19 DIAGNOSIS — I10 Essential (primary) hypertension: Secondary | ICD-10-CM | POA: Diagnosis not present

## 2018-12-19 DIAGNOSIS — E785 Hyperlipidemia, unspecified: Secondary | ICD-10-CM | POA: Diagnosis not present

## 2018-12-19 DIAGNOSIS — E1159 Type 2 diabetes mellitus with other circulatory complications: Secondary | ICD-10-CM | POA: Diagnosis not present

## 2018-12-19 DIAGNOSIS — E1169 Type 2 diabetes mellitus with other specified complication: Secondary | ICD-10-CM | POA: Diagnosis not present

## 2018-12-23 DIAGNOSIS — H524 Presbyopia: Secondary | ICD-10-CM | POA: Diagnosis not present

## 2019-01-17 ENCOUNTER — Other Ambulatory Visit: Payer: Self-pay

## 2019-01-17 DIAGNOSIS — Z20822 Contact with and (suspected) exposure to covid-19: Secondary | ICD-10-CM

## 2019-01-17 DIAGNOSIS — Z20828 Contact with and (suspected) exposure to other viral communicable diseases: Secondary | ICD-10-CM | POA: Diagnosis not present

## 2019-01-20 ENCOUNTER — Telehealth: Payer: Self-pay | Admitting: General Practice

## 2019-01-20 LAB — NOVEL CORONAVIRUS, NAA: SARS-CoV-2, NAA: NOT DETECTED

## 2019-01-20 NOTE — Telephone Encounter (Signed)
Gave patient negative covid test results Patient understood 

## 2019-01-23 ENCOUNTER — Telehealth: Payer: Self-pay | Admitting: Family Medicine

## 2019-01-23 NOTE — Telephone Encounter (Signed)
LVM to r/s appt on 02/13/2019 with Dr. Nolon Rod. Provider will be out of the office on that day

## 2019-01-31 DIAGNOSIS — H40003 Preglaucoma, unspecified, bilateral: Secondary | ICD-10-CM | POA: Diagnosis not present

## 2019-01-31 DIAGNOSIS — H25813 Combined forms of age-related cataract, bilateral: Secondary | ICD-10-CM | POA: Diagnosis not present

## 2019-01-31 DIAGNOSIS — E119 Type 2 diabetes mellitus without complications: Secondary | ICD-10-CM | POA: Diagnosis not present

## 2019-02-13 ENCOUNTER — Ambulatory Visit: Payer: Medicare HMO | Admitting: Family Medicine

## 2019-03-20 DIAGNOSIS — R202 Paresthesia of skin: Secondary | ICD-10-CM | POA: Diagnosis not present

## 2019-03-20 DIAGNOSIS — M47816 Spondylosis without myelopathy or radiculopathy, lumbar region: Secondary | ICD-10-CM | POA: Diagnosis not present

## 2019-03-20 DIAGNOSIS — E1169 Type 2 diabetes mellitus with other specified complication: Secondary | ICD-10-CM | POA: Diagnosis not present

## 2019-03-20 DIAGNOSIS — M1611 Unilateral primary osteoarthritis, right hip: Secondary | ICD-10-CM | POA: Diagnosis not present

## 2019-03-20 DIAGNOSIS — I10 Essential (primary) hypertension: Secondary | ICD-10-CM | POA: Diagnosis not present

## 2019-03-20 DIAGNOSIS — R8290 Unspecified abnormal findings in urine: Secondary | ICD-10-CM | POA: Diagnosis not present

## 2019-03-20 DIAGNOSIS — M5136 Other intervertebral disc degeneration, lumbar region: Secondary | ICD-10-CM | POA: Diagnosis not present

## 2019-03-20 DIAGNOSIS — N898 Other specified noninflammatory disorders of vagina: Secondary | ICD-10-CM | POA: Diagnosis not present

## 2019-03-20 DIAGNOSIS — E1159 Type 2 diabetes mellitus with other circulatory complications: Secondary | ICD-10-CM | POA: Diagnosis not present

## 2019-03-20 DIAGNOSIS — E785 Hyperlipidemia, unspecified: Secondary | ICD-10-CM | POA: Diagnosis not present

## 2019-03-20 DIAGNOSIS — R300 Dysuria: Secondary | ICD-10-CM | POA: Diagnosis not present

## 2019-03-31 IMAGING — CT CT RENAL STONE PROTOCOL
2 of 3 series · 16 of 46 positions shown, 18 images · non-contrast
Comparison: CT Abdomen and Pelvis 06/10/2012.

CLINICAL DATA: 71-year-old female with right flank pain since 6466
hours.

EXAM:
CT ABDOMEN AND PELVIS WITHOUT CONTRAST
TECHNIQUE: Multidetector CT imaging of the abdomen and pelvis was performed
following the standard protocol without IV contrast.

[Series 4: lung · axial · 0.78mm/px · z∈[-118,-16]mm · 13 of 59 slices shown, 15 images]
[im 4/59  soft-tissue]
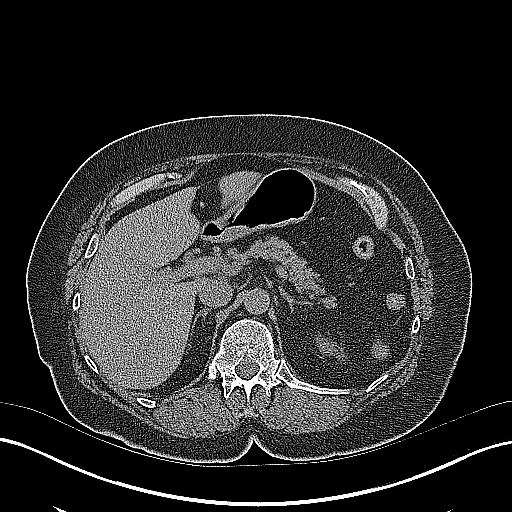
[im 4/59  bone]
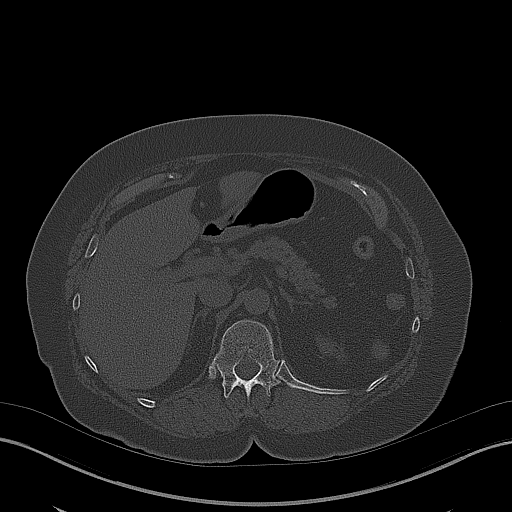
[im 8/59  soft-tissue]
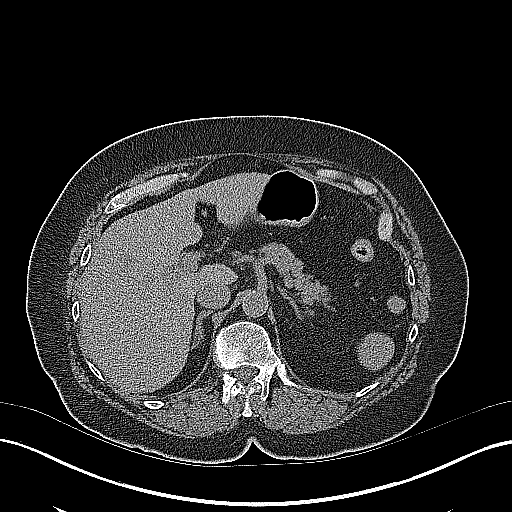
[im 12/59  soft-tissue]
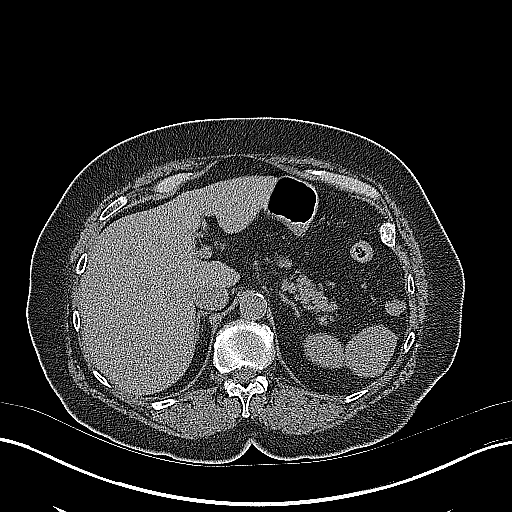
[im 17/59  soft-tissue]
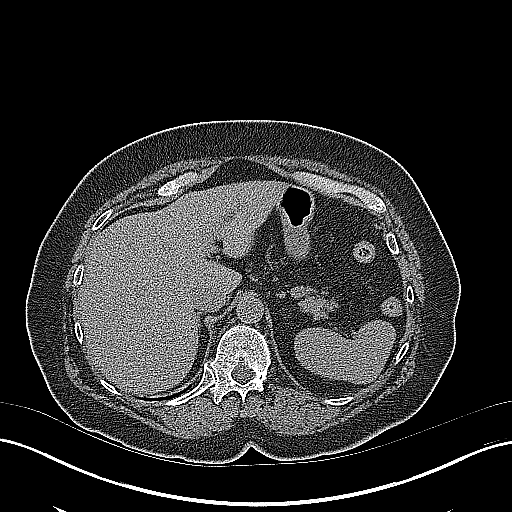
[im 21/59  soft-tissue]
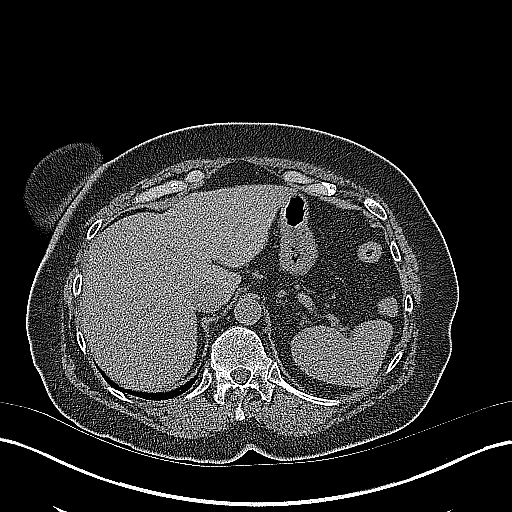
[im 25/59  soft-tissue]
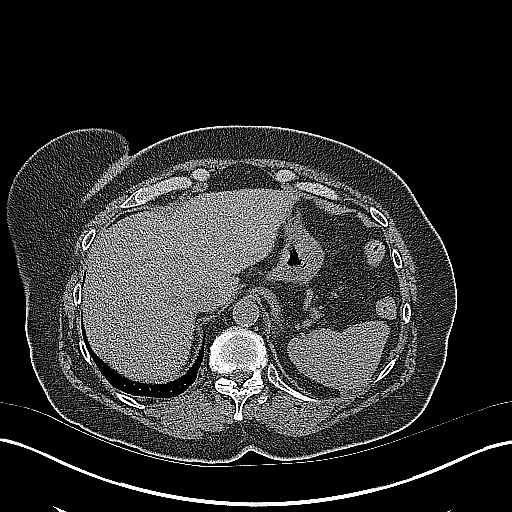
[im 30/59  soft-tissue]
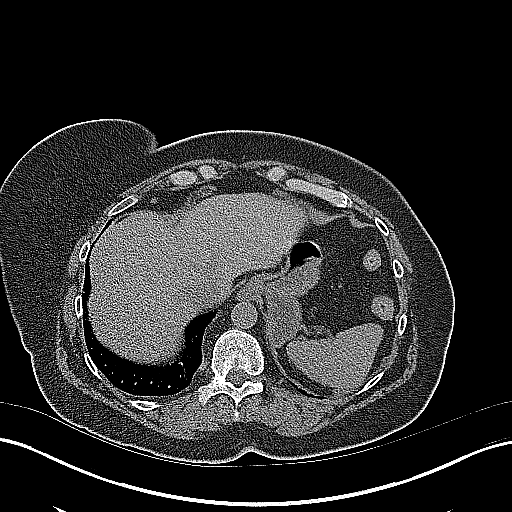
[im 34/59  soft-tissue]
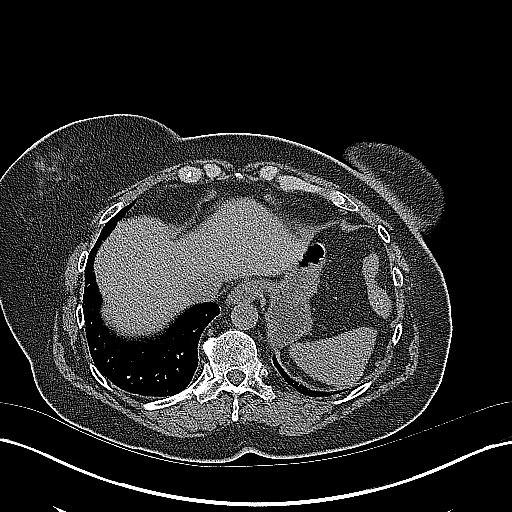
[im 38/59  soft-tissue]
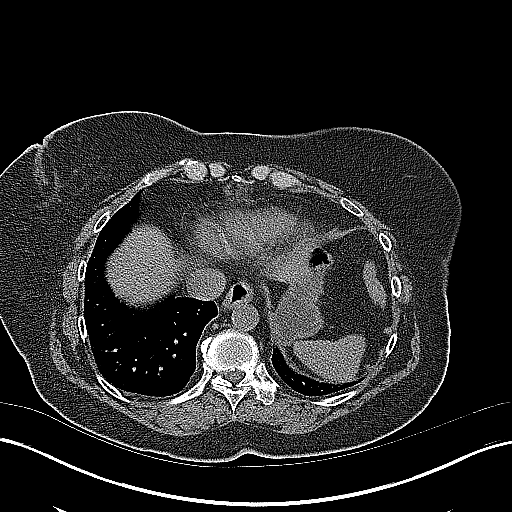
[im 38/59  bone]
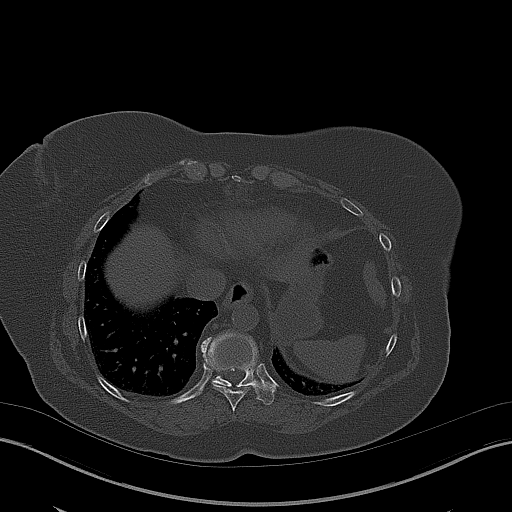
[im 42/59  soft-tissue]
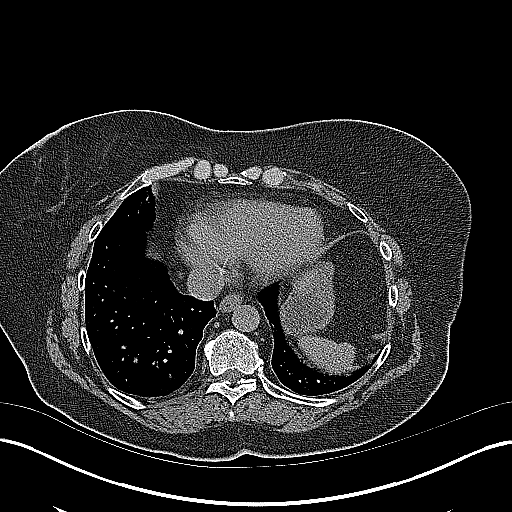
[im 47/59  soft-tissue]
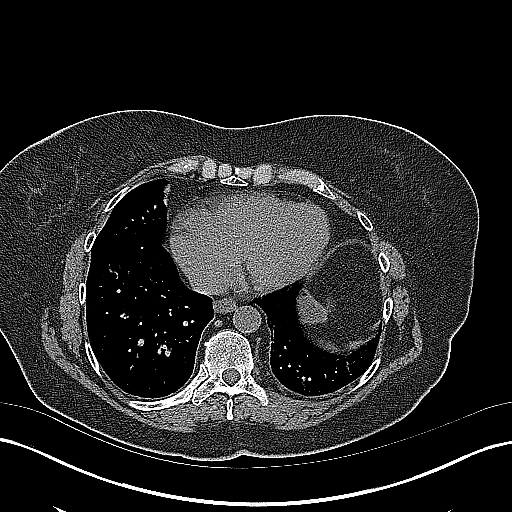
[im 51/59  soft-tissue]
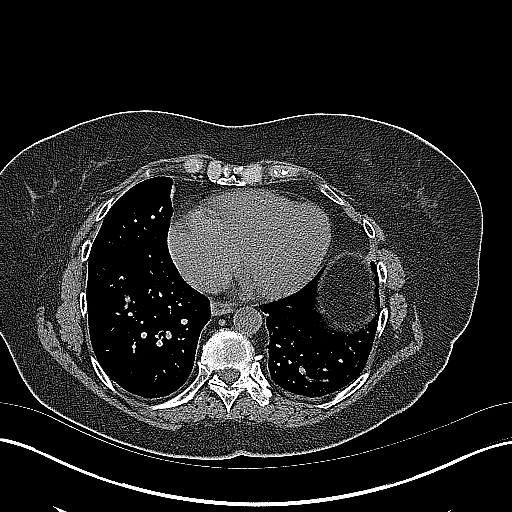
[im 55/59  soft-tissue]
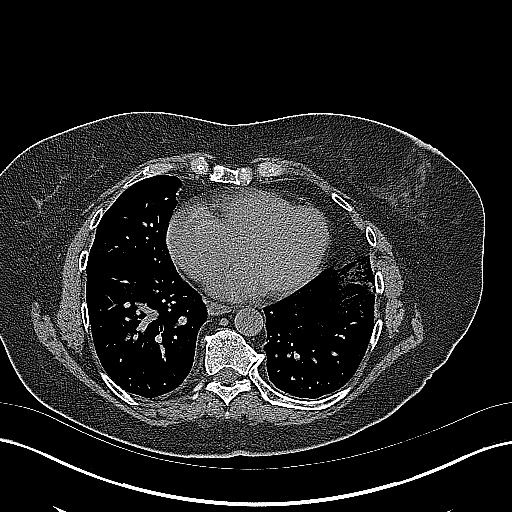

[Series 5: coronal · coronal · 0.77mm/px · 3 of 139 slices shown]
[im 47/139  soft-tissue]
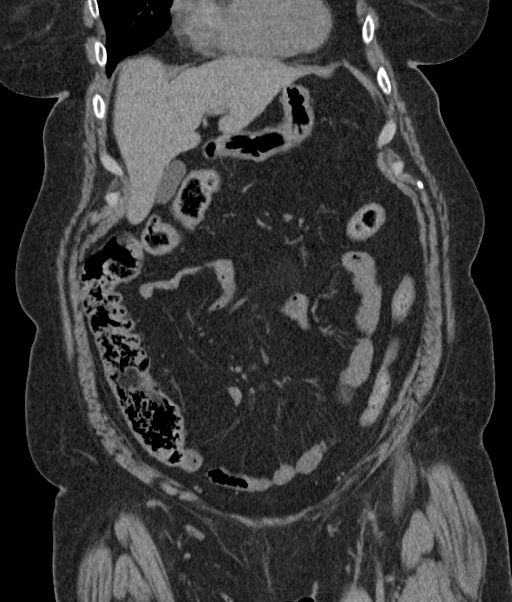
[im 62/139  soft-tissue]
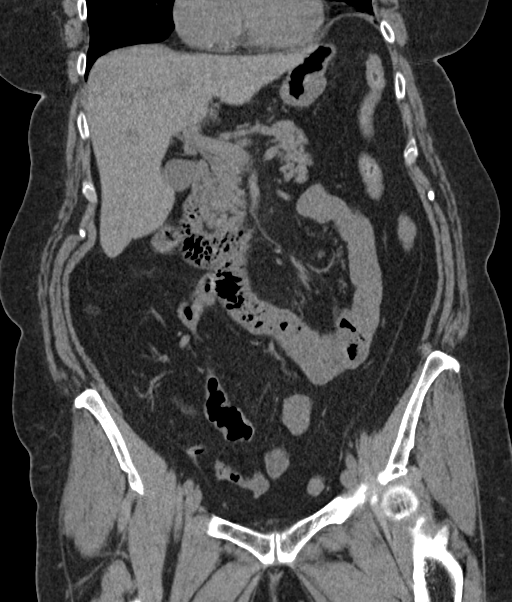
[im 77/139  soft-tissue]
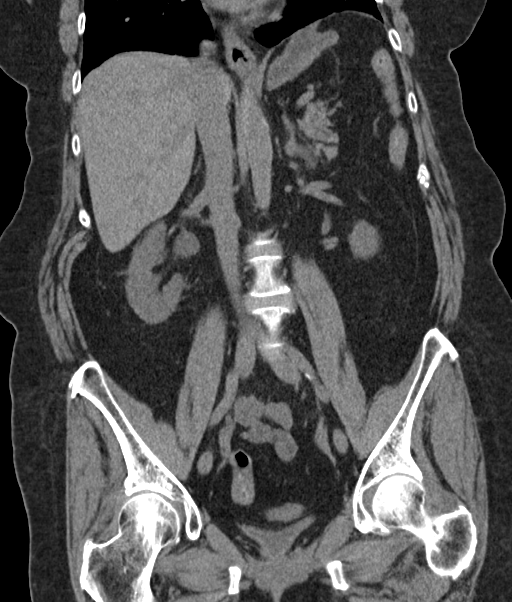

[16 of 46 positions shown; findings below may reference images not displayed]

FINDINGS: Lower chest: Mild lung base atelectasis. Chronic mild elevation of
the left hemidiaphragm. Borderline to mild cardiomegaly. No
pericardial or pleural effusion.

Hepatobiliary: Negative noncontrast liver and gallbladder.

Pancreas: Negative.

Spleen: Negative.

Adrenals/Urinary Tract: Normal adrenal glands.

Negative noncontrast left kidney and course of the left ureter.

Mild to moderate right hydronephrosis and right pararenal space
stranding. Right hydroureter with periureteral stranding to the
level of the ureterovesical junction where a now oblong 4-5 mm
calculus is present (series 2, image 82). Decompressed urinary
bladder.

No intrarenal calculi or other urologic calculus identified.

Stomach/Bowel: Negative rectum. Minimal sigmoid diverticula.
Decompressed descending and distal colon. Negative transverse colon.
Mildly redundant hepatic flexure. Negative right colon and appendix.
Negative terminal ileum. No dilated small bowel. Negative stomach
and duodenum.

No abdominal free fluid.

Vascular/Lymphatic: Calcified aortic atherosclerosis. Vascular
patency is not evaluated in the absence of IV contrast.

No lymphadenopathy.

Reproductive: Negative.

Other: No pelvic free fluid.

Musculoskeletal: No acute osseous abnormality identified.
IMPRESSION: 1. Acute obstructive uropathy on the right with a 4-5 mm calculus at
the UVJ. Possible mild forniceal rupture.
2. No other urologic calculus.
3. Mild Calcified Aortic atherosclerosis.

## 2019-04-12 DIAGNOSIS — M4726 Other spondylosis with radiculopathy, lumbar region: Secondary | ICD-10-CM | POA: Diagnosis not present

## 2019-04-12 DIAGNOSIS — I10 Essential (primary) hypertension: Secondary | ICD-10-CM | POA: Diagnosis not present

## 2019-04-12 DIAGNOSIS — M48061 Spinal stenosis, lumbar region without neurogenic claudication: Secondary | ICD-10-CM | POA: Diagnosis not present

## 2019-04-12 DIAGNOSIS — M5136 Other intervertebral disc degeneration, lumbar region: Secondary | ICD-10-CM | POA: Diagnosis not present

## 2019-04-12 DIAGNOSIS — E1159 Type 2 diabetes mellitus with other circulatory complications: Secondary | ICD-10-CM | POA: Diagnosis not present

## 2019-04-12 DIAGNOSIS — M47816 Spondylosis without myelopathy or radiculopathy, lumbar region: Secondary | ICD-10-CM | POA: Diagnosis not present

## 2019-04-17 DIAGNOSIS — M25551 Pain in right hip: Secondary | ICD-10-CM | POA: Diagnosis not present

## 2019-04-17 DIAGNOSIS — N904 Leukoplakia of vulva: Secondary | ICD-10-CM | POA: Diagnosis not present

## 2019-04-17 DIAGNOSIS — M25651 Stiffness of right hip, not elsewhere classified: Secondary | ICD-10-CM | POA: Diagnosis not present

## 2019-04-17 DIAGNOSIS — M79651 Pain in right thigh: Secondary | ICD-10-CM | POA: Diagnosis not present

## 2019-04-17 DIAGNOSIS — L292 Pruritus vulvae: Secondary | ICD-10-CM | POA: Diagnosis not present

## 2019-04-17 DIAGNOSIS — R262 Difficulty in walking, not elsewhere classified: Secondary | ICD-10-CM | POA: Diagnosis not present

## 2019-04-17 DIAGNOSIS — M6281 Muscle weakness (generalized): Secondary | ICD-10-CM | POA: Diagnosis not present

## 2019-04-19 DIAGNOSIS — M25551 Pain in right hip: Secondary | ICD-10-CM | POA: Diagnosis not present

## 2019-04-19 DIAGNOSIS — M25651 Stiffness of right hip, not elsewhere classified: Secondary | ICD-10-CM | POA: Diagnosis not present

## 2019-04-19 DIAGNOSIS — M6281 Muscle weakness (generalized): Secondary | ICD-10-CM | POA: Diagnosis not present

## 2019-04-19 DIAGNOSIS — M79651 Pain in right thigh: Secondary | ICD-10-CM | POA: Diagnosis not present

## 2019-04-19 DIAGNOSIS — R262 Difficulty in walking, not elsewhere classified: Secondary | ICD-10-CM | POA: Diagnosis not present

## 2019-04-20 DIAGNOSIS — M25551 Pain in right hip: Secondary | ICD-10-CM | POA: Diagnosis not present

## 2019-04-20 DIAGNOSIS — M25651 Stiffness of right hip, not elsewhere classified: Secondary | ICD-10-CM | POA: Diagnosis not present

## 2019-04-20 DIAGNOSIS — M79651 Pain in right thigh: Secondary | ICD-10-CM | POA: Diagnosis not present

## 2019-04-20 DIAGNOSIS — M6281 Muscle weakness (generalized): Secondary | ICD-10-CM | POA: Diagnosis not present

## 2019-04-20 DIAGNOSIS — R262 Difficulty in walking, not elsewhere classified: Secondary | ICD-10-CM | POA: Diagnosis not present

## 2019-04-24 DIAGNOSIS — M25651 Stiffness of right hip, not elsewhere classified: Secondary | ICD-10-CM | POA: Diagnosis not present

## 2019-04-24 DIAGNOSIS — M6281 Muscle weakness (generalized): Secondary | ICD-10-CM | POA: Diagnosis not present

## 2019-04-24 DIAGNOSIS — M79651 Pain in right thigh: Secondary | ICD-10-CM | POA: Diagnosis not present

## 2019-04-24 DIAGNOSIS — R262 Difficulty in walking, not elsewhere classified: Secondary | ICD-10-CM | POA: Diagnosis not present

## 2019-04-24 DIAGNOSIS — M25551 Pain in right hip: Secondary | ICD-10-CM | POA: Diagnosis not present

## 2019-04-26 DIAGNOSIS — M6281 Muscle weakness (generalized): Secondary | ICD-10-CM | POA: Diagnosis not present

## 2019-04-26 DIAGNOSIS — M79651 Pain in right thigh: Secondary | ICD-10-CM | POA: Diagnosis not present

## 2019-04-26 DIAGNOSIS — R262 Difficulty in walking, not elsewhere classified: Secondary | ICD-10-CM | POA: Diagnosis not present

## 2019-04-26 DIAGNOSIS — M25651 Stiffness of right hip, not elsewhere classified: Secondary | ICD-10-CM | POA: Diagnosis not present

## 2019-04-26 DIAGNOSIS — M25551 Pain in right hip: Secondary | ICD-10-CM | POA: Diagnosis not present

## 2019-05-01 DIAGNOSIS — M25651 Stiffness of right hip, not elsewhere classified: Secondary | ICD-10-CM | POA: Diagnosis not present

## 2019-05-01 DIAGNOSIS — M6281 Muscle weakness (generalized): Secondary | ICD-10-CM | POA: Diagnosis not present

## 2019-05-01 DIAGNOSIS — M25551 Pain in right hip: Secondary | ICD-10-CM | POA: Diagnosis not present

## 2019-05-01 DIAGNOSIS — R262 Difficulty in walking, not elsewhere classified: Secondary | ICD-10-CM | POA: Diagnosis not present

## 2019-05-01 DIAGNOSIS — M79651 Pain in right thigh: Secondary | ICD-10-CM | POA: Diagnosis not present

## 2019-05-05 DIAGNOSIS — M25651 Stiffness of right hip, not elsewhere classified: Secondary | ICD-10-CM | POA: Diagnosis not present

## 2019-05-05 DIAGNOSIS — R262 Difficulty in walking, not elsewhere classified: Secondary | ICD-10-CM | POA: Diagnosis not present

## 2019-05-05 DIAGNOSIS — M6281 Muscle weakness (generalized): Secondary | ICD-10-CM | POA: Diagnosis not present

## 2019-05-05 DIAGNOSIS — M79651 Pain in right thigh: Secondary | ICD-10-CM | POA: Diagnosis not present

## 2019-05-05 DIAGNOSIS — M25551 Pain in right hip: Secondary | ICD-10-CM | POA: Diagnosis not present

## 2019-05-08 DIAGNOSIS — M25651 Stiffness of right hip, not elsewhere classified: Secondary | ICD-10-CM | POA: Diagnosis not present

## 2019-05-08 DIAGNOSIS — M6281 Muscle weakness (generalized): Secondary | ICD-10-CM | POA: Diagnosis not present

## 2019-05-08 DIAGNOSIS — M25551 Pain in right hip: Secondary | ICD-10-CM | POA: Diagnosis not present

## 2019-05-08 DIAGNOSIS — M79651 Pain in right thigh: Secondary | ICD-10-CM | POA: Diagnosis not present

## 2019-05-08 DIAGNOSIS — R262 Difficulty in walking, not elsewhere classified: Secondary | ICD-10-CM | POA: Diagnosis not present

## 2019-05-08 DIAGNOSIS — L940 Localized scleroderma [morphea]: Secondary | ICD-10-CM | POA: Diagnosis not present

## 2021-02-28 LAB — EXTERNAL GENERIC LAB PROCEDURE: COLOGUARD: NEGATIVE

## 2023-01-04 ENCOUNTER — Emergency Department (HOSPITAL_COMMUNITY): Payer: Medicare HMO

## 2023-01-04 ENCOUNTER — Encounter (HOSPITAL_COMMUNITY): Payer: Self-pay | Admitting: Radiology

## 2023-01-04 ENCOUNTER — Emergency Department (HOSPITAL_COMMUNITY)
Admission: EM | Admit: 2023-01-04 | Discharge: 2023-01-04 | Disposition: A | Discharge status: Home / Self Care | Payer: Medicare HMO | Attending: Emergency Medicine | Admitting: Emergency Medicine

## 2023-01-04 ENCOUNTER — Other Ambulatory Visit: Payer: Self-pay

## 2023-01-04 DIAGNOSIS — S32032A Unstable burst fracture of third lumbar vertebra, initial encounter for closed fracture: Secondary | ICD-10-CM | POA: Insufficient documentation

## 2023-01-04 DIAGNOSIS — Z7982 Long term (current) use of aspirin: Secondary | ICD-10-CM | POA: Insufficient documentation

## 2023-01-04 DIAGNOSIS — S32302A Unspecified fracture of left ilium, initial encounter for closed fracture: Secondary | ICD-10-CM

## 2023-01-04 DIAGNOSIS — S32049A Unspecified fracture of fourth lumbar vertebra, initial encounter for closed fracture: Secondary | ICD-10-CM | POA: Insufficient documentation

## 2023-01-04 DIAGNOSIS — W1830XA Fall on same level, unspecified, initial encounter: Secondary | ICD-10-CM | POA: Insufficient documentation

## 2023-01-04 DIAGNOSIS — Z79899 Other long term (current) drug therapy: Secondary | ICD-10-CM | POA: Insufficient documentation

## 2023-01-04 DIAGNOSIS — Z7984 Long term (current) use of oral hypoglycemic drugs: Secondary | ICD-10-CM | POA: Insufficient documentation

## 2023-01-04 DIAGNOSIS — Z23 Encounter for immunization: Secondary | ICD-10-CM | POA: Diagnosis not present

## 2023-01-04 DIAGNOSIS — S3210XA Unspecified fracture of sacrum, initial encounter for closed fracture: Secondary | ICD-10-CM | POA: Insufficient documentation

## 2023-01-04 DIAGNOSIS — E119 Type 2 diabetes mellitus without complications: Secondary | ICD-10-CM | POA: Insufficient documentation

## 2023-01-04 DIAGNOSIS — I10 Essential (primary) hypertension: Secondary | ICD-10-CM | POA: Diagnosis not present

## 2023-01-04 DIAGNOSIS — S0083XA Contusion of other part of head, initial encounter: Secondary | ICD-10-CM | POA: Diagnosis not present

## 2023-01-04 DIAGNOSIS — M545 Low back pain, unspecified: Secondary | ICD-10-CM | POA: Diagnosis present

## 2023-01-04 MED ORDER — ACETAMINOPHEN 325 MG PO TABS
650.0000 mg | ORAL_TABLET | Freq: Once | ORAL | Status: AC
  Administered 2023-01-04: 650 mg via ORAL
  Filled 2023-01-04: qty 2

## 2023-01-04 MED ORDER — ONDANSETRON 4 MG PO TBDP
4.0000 mg | ORAL_TABLET | Freq: Once | ORAL | Status: AC
  Administered 2023-01-04: 4 mg via ORAL
  Filled 2023-01-04: qty 1

## 2023-01-04 MED ORDER — ONDANSETRON HCL 4 MG PO TABS: 4.0000 mg | ORAL_TABLET | Freq: Four times a day (QID) | ORAL | 0 refills | Status: AC

## 2023-01-04 MED ORDER — TETANUS-DIPHTH-ACELL PERTUSSIS 5-2.5-18.5 LF-MCG/0.5 IM SUSY
0.5000 mL | PREFILLED_SYRINGE | Freq: Once | INTRAMUSCULAR | Status: AC
  Administered 2023-01-04: 0.5 mL via INTRAMUSCULAR
  Filled 2023-01-04: qty 0.5

## 2023-01-04 MED ORDER — HYDROCODONE-ACETAMINOPHEN 5-325 MG PO TABS: 1.0000 | ORAL_TABLET | Freq: Four times a day (QID) | ORAL | 0 refills | Status: AC | PRN

## 2023-01-04 NOTE — Discharge Instructions (Signed)
Please follow-up with the neurosurgeon I have attached your for you today.  Today your CT shows a of multiple fractures in her pelvis, low back that will need to be evaluated by a neurosurgeon.  You may take Tylenol every 6 hours as needed for pain however I prescribed for you a few days worth of Norco for pain not controlled by the Tylenol.  Please avoid physical activity or activities that may leaving a position in which you may fall as this will worsen your symptoms.  I have also prescribed Zofran to help with your nausea.  Please use your walker to get around as you are able to see the neurosurgeon.  If symptoms change or worsen please return to the ER.

## 2023-01-04 NOTE — ED Notes (Signed)
Updated patient that we are waiting for her CT scans to result. Pt verbalized understanding.

## 2023-01-04 NOTE — ED Triage Notes (Signed)
Pt arrived via POV. C/o HA w/ N/V, and pain in back after fall 3x days agp. Pt unsure if they had LOC. Hematoma on back of head. Not on blood thinners.  AOx4

## 2023-01-04 NOTE — ED Provider Notes (Signed)
[place] EMERGENCY DEPARTMENT AT [facility] Provider Note   CSN: [id number] Arrival date & time: 01/04/23  4098     History  Chief Complaint  Patient presents with   Fall   Head Injury   Back Pain    Kristen Pratt is a 77 y.o. female history of lumbar disc surgery, spine surgery, diabetes, hypertension, hyperlipidemia presented after a fall that occurred 3 nights ago.  Patient was walking down the stairs when her toe got stuck on the step and she slipped backwards and landed on her head and lost consciousness.  Patient states that she the back of her head hurts along with her neck and low back.  Patient states that she does have some weakness standing up however is able to walk but that this exacerbates her back pain.  Patient denies urinary/bowel incontinence, paresthesias, saddle anesthesia, abdominal pain.  Patient does note that since the fall with her head she has had some nausea and vomiting as well and does not feel that she can eat.  Patient states that her neck hurts but states that she can move her neck without it hurting and does not hurt to press on her neck.  Patient denies upper paresthesias as well.  Patient denied blood thinners.  Patient denies chest pain, shortness of breath, new focal weakness, dysuria  Home Medications Prior to Admission medications   Medication Sig Start Date End Date Taking? Authorizing Provider  HYDROcodone-acetaminophen (NORCO) 5-325 MG tablet Take 1 tablet by mouth every 6 (six) hours as needed for up to 3 days for moderate pain (pain score 4-6). 01/04/23 01/07/23 Yes Lanisa Ishler, Beverly Gust, PA-C  ondansetron (ZOFRAN) 4 MG tablet Take 1 tablet (4 mg total) by mouth every 6 (six) hours. 01/04/23  Yes Netta Corrigan, PA-C  aspirin 81 MG tablet Take 81 mg by mouth daily.     [provider]  atorvastatin (LIPITOR) 40 MG tablet Take 1 tablet (40 mg total) by mouth daily. 08/15/18   Doristine Bosworth, MD  losartan (COZAAR) 50 MG  tablet Take 1 tablet (50 mg total) by mouth daily. 08/15/18   Doristine Bosworth, MD  metFORMIN (GLUCOPHAGE-XR) 500 MG 24 hr tablet Take 1 tablet (500 mg total) by mouth daily. 08/15/18   Doristine Bosworth, MD  naproxen sodium (ALEVE) 220 MG tablet Take 220 mg by mouth daily as needed (pain).    [provider]  triamcinolone cream (KENALOG) 0.1 % Apply 1 application topically 2 (two) times daily as needed (dry skin). 08/15/18   Doristine Bosworth, MD      Allergies    Patient has no known allergies.    Review of Systems   Review of Systems  Musculoskeletal:  Positive for back pain.    Physical Exam Updated Vital Signs BP (!) 161/61 (BP Location: Right Arm)   Pulse 78   Temp 98.4 F (36.9 C) (Oral)   Resp 18   Ht 5\' 7"  (1.702 m)   Wt 83.9 kg   SpO2 100%   BMI 28.98 kg/m  Physical Exam Vitals reviewed.  Constitutional:      General: She is not in acute distress. HENT:     Head: Normocephalic.     Comments: Small hematoma noted to the posterior aspect of the head with a 2 cm laceration with granulation tissue overlying it with no signs of erythema, warmth, discharge, fluctuance    Right Ear: Tympanic membrane, ear canal and external ear normal.  Left Ear: Tympanic membrane, ear canal and external ear normal.  Eyes:     Extraocular Movements: Extraocular movements intact.     Conjunctiva/sclera: Conjunctivae normal.     Pupils: Pupils are equal, round, and reactive to light.  Neck:     Comments: Unable to reproduce patient's neck pain with palpation, no midline tenderness or abnormalities palpated Cardiovascular:     Rate and Rhythm: Normal rate and regular rhythm.     Pulses: Normal pulses.     Heart sounds: Normal heart sounds.     Comments: 2+ bilateral radial/dorsalis pedis pulses with regular rate Pulmonary:     Effort: Pulmonary effort is normal. No respiratory distress.     Breath sounds: Normal breath sounds.  Abdominal:     Palpations: Abdomen is soft.      Tenderness: There is no abdominal tenderness. There is no guarding or rebound.  Musculoskeletal:        General: Normal range of motion.     Cervical back: Normal range of motion and neck supple.     Comments: 5 out of 5 bilateral grip/leg extension strength No midline tenderness of the spine or bony abnormalities palpated Pelvis was stable upon palpation  Skin:    General: Skin is warm and dry.     Capillary Refill: Capillary refill takes less than 2 seconds.  Neurological:     General: No focal deficit present.     Mental Status: She is alert and oriented to person, place, and time.     Comments: Sensation intact in all 4 limbs Able to bear weight and walk without abnormalities  Psychiatric:        Mood and Affect: Mood normal.     ED Results / Procedures / Treatments   Labs (all labs ordered are listed, but only abnormal results are displayed) Labs Reviewed - No data to display  EKG None  Radiology CT PELVIS WO CONTRAST  Addendum Date: 01/04/2023   ADDENDUM REPORT: 01/04/2023 14:28 ADDENDUM: Musculoskeletal: Anterior cortical irregularity of the S2 vertebral body is concerning for a fracture which appears to extend into the left sacral ala (series 7, image 105) with subtle lucency extending to the level of the left SI joint. Sacroiliac joints are anatomically aligned without evidence of diastasis. The femoral heads are seated within the acetabula. Degenerative changes of the hips manifested by superolateral joint space narrowing and marginal osteophytosis, more pronounced on the left. The pubic symphysis is anatomically aligned with degenerative changes. Bilateral iliac crest and greater trochanteric enthesopathy. Disc height loss and desiccation at L4-L5 IMPRESSION: 1. Findings concerning for a nondisplaced fracture of the S2 vertebral body with extension into the left sacral ala and possibly to the level of the left SI joint. 2. 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 Electronically Signed   By: Hart Robinsons M.D.   On: 01/04/2023 14:28   Result Date: 01/04/2023 CLINICAL DATA:  Pelvic pain following fall. EXAM: CT PELVIS WITHOUT CONTRAST TECHNIQUE: Multidetector CT imaging of the pelvis was performed following the standard protocol without intravenous contrast. RADIATION DOSE REDUCTION: This exam was performed according to the departmental dose-optimization program which includes automated exposure control, adjustment of the mA and/or kV according to patient size and/or use of iterative reconstruction technique. COMPARISON:  CT abdomen/pelvis dated December 09, 2016. FINDINGS: Urinary Tract:  No abnormality visualized. Bowel: Scattered colonic diverticula without evidence of acute diverticulitis. No abdominopelvic ascites. Vascular/Lymphatic: No enlarged lymph nodes identified in the field of  view. Atherosclerotic calcifications of the bilateral common iliac and femoral arteries. Reproductive:  No mass or other significant abnormality Musculoskeletal: No acute fracture or dislocation. The femoral heads are seated within the acetabula. Degenerative changes of the hips manifested by superolateral joint space narrowing and marginal osteophytosis, more pronounced on the left. The sacroiliac joints and pubic symphysis are anatomically aligned with degenerative changes. Bilateral iliac crest and greater trochanteric enthesopathy. Disc height loss and desiccation at L4-L5. IMPRESSION: 1. No acute osseous abnormality. 2. Osteoarthritis of the hips, more pronounced on the left. Electronically Signed: By: Hart Robinsons M.D. On: 01/04/2023 13:54   CT Lumbar Spine Wo Contrast  Result Date: 01/04/2023 CLINICAL DATA:  fall, back pain EXAM: CT LUMBAR SPINE WITHOUT CONTRAST TECHNIQUE: Multidetector CT imaging of the lumbar spine was performed without intravenous contrast administration. Multiplanar CT image reconstructions were also generated. RADIATION DOSE REDUCTION: This  exam was performed according to the departmental dose-optimization program which includes automated exposure control, adjustment of the mA and/or kV according to patient size and/or use of iterative reconstruction technique. COMPARISON:  CT Renal stone 12/09/16 FINDINGS: Segmentation: 5 lumbar type vertebrae. Alignment: Trace anterolisthesis of L4 on L5 and L5 on S1. Vertebrae: There is likely an acute right transverse process fracture at L4 (series 3, image 66). There is also an acute fracture through the S2 vertebral body (series 7, image 36/series 5, image 42) that extends into the bilateral S1-S2 neural foramina. There is also an acute fracture through the sacral ala on the left (series 3, image 114). There may be an additional nondisplaced fracture through the iliac crest on the left (series 5, image 61). Paraspinal and other soft tissues: Negative. Disc levels: No evidence of high-grade spinal canal stenosis. Prior left hemilaminectomy at L4-L5. IMPRESSION: 1. Acute fracture through the S2 vertebral body that extends into the bilateral S1-S2 neuroforamina. 2. Acute fracture through the sacral ala on the left. 3. Acute right transverse process fracture at L4. 4. Possible additional nondisplaced fracture through the iliac crest on the left. Electronically Signed   By: Lorenza Cambridge M.D.   On: 01/04/2023 14:12   CT Head Wo Contrast  Result Date: 01/04/2023 CLINICAL DATA:  fall, hematoma; fall, neck pain EXAM: CT HEAD WITHOUT CONTRAST CT CERVICAL SPINE WITHOUT CONTRAST TECHNIQUE: Multidetector CT imaging of the head and cervical spine was performed following the standard protocol without intravenous contrast. Multiplanar CT image reconstructions of the cervical spine were also generated. RADIATION DOSE REDUCTION: This exam was performed according to the departmental dose-optimization program which includes automated exposure control, adjustment of the mA and/or kV according to patient size and/or use of  iterative reconstruction technique. COMPARISON:  None Available. FINDINGS: CT HEAD FINDINGS Brain: No hemorrhage. No hydrocephalus. No extra-axial fluid collection. No CT evidence of an acute cortical infarct. No mass effect. No mass lesion. Vascular: No hyperdense vessel or unexpected calcification. Skull: Normal. Negative for fracture or focal lesion. Likely sebaceous cysts along the posterior scalp. Soft tissue swelling along the posterior scalp. No evidence of an underlying calvarial fracture. Sinuses/Orbits: No middle ear or mastoid effusion. Paranasal sinuses clear. Orbits are unremarkable. Other: None. CT CERVICAL SPINE FINDINGS Alignment: Normal. Skull base and vertebrae: No acute fracture. No primary bone lesion or focal pathologic process. Soft tissues and spinal canal: No prevertebral fluid or swelling. No visible canal hematoma. Disc levels:  No evidence of high-grade spinal canal stenosis Upper chest: Negative. Other: None IMPRESSION: 1. No CT evidence of intracranial injury. 2. Soft tissue swelling  along the posterior scalp. No evidence of an underlying calvarial fracture. 3. No acute fracture or traumatic subluxation of the cervical spine. Electronically Signed   By: Lorenza Cambridge M.D.   On: 01/04/2023 13:50   CT Cervical Spine Wo Contrast  Result Date: 01/04/2023 CLINICAL DATA:  fall, hematoma; fall, neck pain EXAM: CT HEAD WITHOUT CONTRAST CT CERVICAL SPINE WITHOUT CONTRAST TECHNIQUE: Multidetector CT imaging of the head and cervical spine was performed following the standard protocol without intravenous contrast. Multiplanar CT image reconstructions of the cervical spine were also generated. RADIATION DOSE REDUCTION: This exam was performed according to the departmental dose-optimization program which includes automated exposure control, adjustment of the mA and/or kV according to patient size and/or use of iterative reconstruction technique. COMPARISON:  None Available. FINDINGS: CT HEAD  FINDINGS Brain: No hemorrhage. No hydrocephalus. No extra-axial fluid collection. No CT evidence of an acute cortical infarct. No mass effect. No mass lesion. Vascular: No hyperdense vessel or unexpected calcification. Skull: Normal. Negative for fracture or focal lesion. Likely sebaceous cysts along the posterior scalp. Soft tissue swelling along the posterior scalp. No evidence of an underlying calvarial fracture. Sinuses/Orbits: No middle ear or mastoid effusion. Paranasal sinuses clear. Orbits are unremarkable. Other: None. CT CERVICAL SPINE FINDINGS Alignment: Normal. Skull base and vertebrae: No acute fracture. No primary bone lesion or focal pathologic process. Soft tissues and spinal canal: No prevertebral fluid or swelling. No visible canal hematoma. Disc levels:  No evidence of high-grade spinal canal stenosis Upper chest: Negative. Other: None IMPRESSION: 1. No CT evidence of intracranial injury. 2. Soft tissue swelling along the posterior scalp. No evidence of an underlying calvarial fracture. 3. No acute fracture or traumatic subluxation of the cervical spine. Electronically Signed   By: Lorenza Cambridge M.D.   On: 01/04/2023 13:50    Procedures Procedures    Medications Ordered in ED Medications  acetaminophen (TYLENOL) tablet 650 mg (650 mg Oral Given 01/04/23 1034)  ondansetron (ZOFRAN-ODT) disintegrating tablet 4 mg (4 mg Oral Given 01/04/23 1034)  Tdap (BOOSTRIX) injection 0.5 mL (0.5 mLs Intramuscular Given 01/04/23 1034)    ED Course/ Medical Decision Making/ A&P                                 Medical Decision Making Amount and/or Complexity of Data Reviewed Radiology: ordered.  Risk OTC drugs. Prescription drug management.   Gordy Savers 77 y.o. presented today for fall. Working DDx that I considered at this time includes, but not limited to, vasovagal episode, mechanical fall, ICH, epidural/subdural hematoma, basilar skull fracture, anemia, electrolyte abnormalities,  drug-induced, arrhythmia, UTI, fracture, contusion, soft tissue injury.  R/o DDx: vasovagal episode, ICH, epidural/subdural hematoma, basilar skull fracture, anemia, electrolyte abnormalities, drug-induced, arrhythmia, UTI: These are considered less likely due to history of present illness, physical exam, lab/imaging findings  Review of prior external notes: 11/20/2022 office visit  Unique Tests and My Interpretation:  CT head without contrast: No acute changes CT pelvis: Nondisplaced fracture of the S2 vertebral body extending into the left sacral ala and possibly left SI joint CT cervical spine without contrast: no acute changes CT lumbar spine without contrast: Acute fracture in the S2 vertebral body that extends into the S1 and S2 neural foramina, acute fracture in the left sacral ala, acute transverse fracture of L4, nondisplaced fracture in iliac crest on the left side  Social Determinants of Health: none  Discussion with Independent  Historian:  Husband  Discussion of Management of Tests:  Pool, MD Neurosurgery  Risk: Medium: prescription drug management  Risk Stratification Score: None  Staffed with Trifan, MD  Plan: On exam patient was no acute distress stable vitals.  Patient's physical exam does show hematoma with healing laceration in the posterior aspect of her head with no signs of infection.  Patient is unsure of last tetanus and so we will update this as well.  I spoke to the patient by how the wound already appears to be healing but does not show any signs of infection and so we will monitor it.  Patient was also endorsing nausea vomiting with her head pain and so will obtain CT head along with neck as patient is endorsing neck pain as well however I was unable to reproduce this neck pain and patient did not have any focal deficits noted.  Patient does have history of lumbar disc surgery and notes that she is having low back pain as well that radiates down both legs and  stops at the knees.  Patient did not endorse any saddle anesthesia, urinary/bowel incontinence or new onset weakness that would be concerning for cauda equina.  Patient did not have any midline tenderness or abnormalities noted on exam as I was unable to reproduce patient's back pain with palpation.  Patient's pelvis was also stable to palpation as well and nontender.  Will obtain CT imaging of the CT spine and pelvis to further evaluate.  Patient given Tylenol at this time and will have tetanus updated.  CT does show multiple acute fractures in the sacral and pelvic area.  Patient was able to stand and bear weight however this does exacerbate pain.  Will call neurosurgery to see if they feel patient is been better can be seen outpatient.  If discharged will most likely need walker.  I spoke to the neurosurgeon on-call and he recommends following up in the office with pain management along with a walker.  Patient given a few days worth of pain meds and encouraged her to use Tylenol every 6 hours and to rest over the next few days and avoid activities that may leave her in a position of falling.  Patient notes that she has a walker at home does not need 1 given to her.  Will give Zofran to help with patient's nausea.  Patient has been drinking and keeping down fluids here in the ER and so thus is passed a p.o. challenge.  Will discharge with neurosurgery follow-up.  Patient was given return precautions. Patient stable for discharge at this time.  Patient verbalized understanding of plan.  This chart was dictated using voice recognition software.  Despite best efforts to proofread,  errors can occur which can change the documentation meaning.         Final Clinical Impression(s) / ED Diagnoses Final diagnoses:  Closed fracture of sacrum, unspecified portion of sacrum, initial encounter (HCC)  Closed fracture of fourth lumbar vertebra, unspecified fracture morphology, initial encounter (HCC)   Closed fracture of left iliac crest, initial encounter [facility])    Rx / DC Orders ED Discharge Orders          Ordered    HYDROcodone-acetaminophen (NORCO) 5-325 MG tablet  Every 6 hours PRN        01/04/23 1504    Walker standard  Status:  Canceled        01/04/23 1504    ondansetron (ZOFRAN) 4 MG tablet  Every 6 hours  01/04/23 1504              Netta Corrigan, PA-C 01/04/23 1521    Terald Sleeper, MD 01/04/23 [phone]
